# Patient Record
Sex: Female | Born: 1975 | Race: White | Hispanic: No | Marital: Married | Smoking: Never smoker
Health system: Southern US, Community
[De-identification: ages and names within clinical notes are randomized; demographics above are authoritative.]

## PROBLEM LIST (undated history)

## (undated) DIAGNOSIS — E119 Type 2 diabetes mellitus without complications: Secondary | ICD-10-CM

## (undated) DIAGNOSIS — G43909 Migraine, unspecified, not intractable, without status migrainosus: Secondary | ICD-10-CM

## (undated) DIAGNOSIS — R112 Nausea with vomiting, unspecified: Secondary | ICD-10-CM

## (undated) DIAGNOSIS — F419 Anxiety disorder, unspecified: Secondary | ICD-10-CM

## (undated) DIAGNOSIS — Z9889 Other specified postprocedural states: Secondary | ICD-10-CM

## (undated) DIAGNOSIS — M542 Cervicalgia: Secondary | ICD-10-CM

## (undated) DIAGNOSIS — K219 Gastro-esophageal reflux disease without esophagitis: Secondary | ICD-10-CM

## (undated) DIAGNOSIS — Z973 Presence of spectacles and contact lenses: Secondary | ICD-10-CM

## (undated) HISTORY — DX: Cervicalgia: M54.2

## (undated) HISTORY — PX: DILATION AND CURETTAGE OF UTERUS: SHX78

## (undated) HISTORY — PX: MELANOMA EXCISION: SHX5266

## (undated) HISTORY — PX: OTHER SURGICAL HISTORY: SHX169

## (undated) HISTORY — DX: Anxiety disorder, unspecified: F41.9

## (undated) HISTORY — DX: Migraine, unspecified, not intractable, without status migrainosus: G43.909

## (undated) HISTORY — DX: Gastro-esophageal reflux disease without esophagitis: K21.9

---

## 1997-10-07 ENCOUNTER — Inpatient Hospital Stay (HOSPITAL_COMMUNITY): Admission: AD | Admit: 1997-10-07 | Discharge: 1997-10-14 | Payer: Self-pay | Admitting: Sports Medicine

## 1997-10-21 ENCOUNTER — Encounter: Admission: RE | Admit: 1997-10-21 | Discharge: 1997-10-21 | Payer: Self-pay | Admitting: Sports Medicine

## 1998-08-31 ENCOUNTER — Other Ambulatory Visit: Admission: RE | Admit: 1998-08-31 | Discharge: 1998-08-31 | Payer: Self-pay | Admitting: Obstetrics and Gynecology

## 1998-12-12 ENCOUNTER — Encounter: Payer: Self-pay | Admitting: Internal Medicine

## 1998-12-12 ENCOUNTER — Ambulatory Visit (HOSPITAL_COMMUNITY): Admission: RE | Admit: 1998-12-12 | Discharge: 1998-12-12 | Payer: Self-pay | Admitting: Internal Medicine

## 1999-02-16 ENCOUNTER — Encounter: Payer: Self-pay | Admitting: Neurosurgery

## 1999-02-16 ENCOUNTER — Ambulatory Visit (HOSPITAL_COMMUNITY): Admission: RE | Admit: 1999-02-16 | Discharge: 1999-02-16 | Payer: Self-pay | Admitting: Neurosurgery

## 1999-03-16 ENCOUNTER — Encounter: Payer: Self-pay | Admitting: Neurology

## 1999-03-16 ENCOUNTER — Ambulatory Visit (HOSPITAL_COMMUNITY): Admission: RE | Admit: 1999-03-16 | Discharge: 1999-03-16 | Payer: Self-pay | Admitting: Neurology

## 1999-07-01 ENCOUNTER — Emergency Department (HOSPITAL_COMMUNITY): Admission: EM | Admit: 1999-07-01 | Discharge: 1999-07-01 | Payer: Self-pay | Admitting: Emergency Medicine

## 1999-07-01 ENCOUNTER — Encounter: Payer: Self-pay | Admitting: Emergency Medicine

## 1999-07-25 ENCOUNTER — Ambulatory Visit (HOSPITAL_COMMUNITY): Admission: RE | Admit: 1999-07-25 | Discharge: 1999-07-25 | Payer: Self-pay | Admitting: *Deleted

## 1999-08-24 ENCOUNTER — Ambulatory Visit (HOSPITAL_COMMUNITY): Admission: RE | Admit: 1999-08-24 | Discharge: 1999-08-24 | Payer: Self-pay | Admitting: Internal Medicine

## 1999-09-14 ENCOUNTER — Encounter: Payer: Self-pay | Admitting: Obstetrics and Gynecology

## 1999-09-14 ENCOUNTER — Encounter: Admission: RE | Admit: 1999-09-14 | Discharge: 1999-09-14 | Payer: Self-pay | Admitting: Obstetrics and Gynecology

## 1999-11-28 ENCOUNTER — Ambulatory Visit (HOSPITAL_COMMUNITY): Admission: RE | Admit: 1999-11-28 | Discharge: 1999-11-28 | Payer: Self-pay | Admitting: Neurology

## 1999-11-28 ENCOUNTER — Encounter: Payer: Self-pay | Admitting: Neurology

## 2000-02-18 ENCOUNTER — Other Ambulatory Visit: Admission: RE | Admit: 2000-02-18 | Discharge: 2000-02-18 | Payer: Self-pay | Admitting: Obstetrics and Gynecology

## 2000-06-30 ENCOUNTER — Emergency Department (HOSPITAL_COMMUNITY): Admission: EM | Admit: 2000-06-30 | Discharge: 2000-06-30 | Payer: Self-pay | Admitting: Emergency Medicine

## 2000-06-30 ENCOUNTER — Encounter: Payer: Self-pay | Admitting: Emergency Medicine

## 2001-11-06 ENCOUNTER — Other Ambulatory Visit: Admission: RE | Admit: 2001-11-06 | Discharge: 2001-11-06 | Payer: Self-pay | Admitting: Obstetrics and Gynecology

## 2002-03-17 ENCOUNTER — Inpatient Hospital Stay (HOSPITAL_COMMUNITY): Admission: AD | Admit: 2002-03-17 | Discharge: 2002-03-17 | Payer: Self-pay | Admitting: Obstetrics and Gynecology

## 2002-04-02 ENCOUNTER — Other Ambulatory Visit: Admission: RE | Admit: 2002-04-02 | Discharge: 2002-04-02 | Payer: Self-pay | Admitting: Obstetrics and Gynecology

## 2002-05-21 ENCOUNTER — Inpatient Hospital Stay (HOSPITAL_COMMUNITY): Admission: AD | Admit: 2002-05-21 | Discharge: 2002-05-21 | Payer: Self-pay | Admitting: Obstetrics and Gynecology

## 2002-07-29 ENCOUNTER — Encounter: Admission: RE | Admit: 2002-07-29 | Discharge: 2002-09-08 | Payer: Self-pay | Admitting: Occupational Medicine

## 2002-08-22 ENCOUNTER — Inpatient Hospital Stay (HOSPITAL_COMMUNITY): Admission: AD | Admit: 2002-08-22 | Discharge: 2002-08-22 | Payer: Self-pay | Admitting: *Deleted

## 2002-08-27 ENCOUNTER — Encounter: Payer: Self-pay | Admitting: Obstetrics and Gynecology

## 2002-08-27 ENCOUNTER — Inpatient Hospital Stay (HOSPITAL_COMMUNITY): Admission: AD | Admit: 2002-08-27 | Discharge: 2002-08-27 | Payer: Self-pay | Admitting: Obstetrics and Gynecology

## 2002-08-28 ENCOUNTER — Inpatient Hospital Stay (HOSPITAL_COMMUNITY): Admission: AD | Admit: 2002-08-28 | Discharge: 2002-08-28 | Payer: Self-pay | Admitting: Obstetrics and Gynecology

## 2002-10-12 ENCOUNTER — Inpatient Hospital Stay (HOSPITAL_COMMUNITY): Admission: AD | Admit: 2002-10-12 | Discharge: 2002-10-15 | Payer: Self-pay | Admitting: Obstetrics & Gynecology

## 2002-11-17 ENCOUNTER — Other Ambulatory Visit: Admission: RE | Admit: 2002-11-17 | Discharge: 2002-11-17 | Payer: Self-pay | Admitting: Obstetrics and Gynecology

## 2003-03-01 ENCOUNTER — Ambulatory Visit (HOSPITAL_COMMUNITY): Admission: RE | Admit: 2003-03-01 | Discharge: 2003-03-01 | Payer: Self-pay | Admitting: Neurology

## 2003-06-14 ENCOUNTER — Emergency Department (HOSPITAL_COMMUNITY): Admission: EM | Admit: 2003-06-14 | Discharge: 2003-06-14 | Payer: Self-pay | Admitting: Family Medicine

## 2003-11-15 ENCOUNTER — Ambulatory Visit (HOSPITAL_COMMUNITY): Admission: RE | Admit: 2003-11-15 | Discharge: 2003-11-15 | Payer: Self-pay | Admitting: Neurology

## 2004-01-03 ENCOUNTER — Emergency Department (HOSPITAL_COMMUNITY): Admission: EM | Admit: 2004-01-03 | Discharge: 2004-01-03 | Payer: Self-pay | Admitting: Family Medicine

## 2004-02-24 ENCOUNTER — Other Ambulatory Visit: Admission: RE | Admit: 2004-02-24 | Discharge: 2004-02-24 | Payer: Self-pay | Admitting: Obstetrics and Gynecology

## 2004-03-04 HISTORY — PX: DILATION AND CURETTAGE OF UTERUS: SHX78

## 2004-04-17 ENCOUNTER — Ambulatory Visit (HOSPITAL_COMMUNITY): Admission: RE | Admit: 2004-04-17 | Discharge: 2004-04-17 | Payer: Self-pay | Admitting: Family Medicine

## 2004-06-06 ENCOUNTER — Inpatient Hospital Stay (HOSPITAL_COMMUNITY): Admission: AD | Admit: 2004-06-06 | Discharge: 2004-06-06 | Payer: Self-pay | Admitting: Obstetrics and Gynecology

## 2004-06-22 ENCOUNTER — Ambulatory Visit (HOSPITAL_COMMUNITY): Admission: RE | Admit: 2004-06-22 | Discharge: 2004-06-22 | Payer: Self-pay | Admitting: Obstetrics and Gynecology

## 2004-06-22 ENCOUNTER — Encounter (INDEPENDENT_AMBULATORY_CARE_PROVIDER_SITE_OTHER): Payer: Self-pay | Admitting: Specialist

## 2004-09-09 ENCOUNTER — Inpatient Hospital Stay (HOSPITAL_COMMUNITY): Admission: AD | Admit: 2004-09-09 | Discharge: 2004-09-09 | Payer: Self-pay | Admitting: Obstetrics and Gynecology

## 2004-12-28 ENCOUNTER — Inpatient Hospital Stay (HOSPITAL_COMMUNITY): Admission: AD | Admit: 2004-12-28 | Discharge: 2004-12-28 | Payer: Self-pay | Admitting: Obstetrics and Gynecology

## 2005-03-14 ENCOUNTER — Inpatient Hospital Stay (HOSPITAL_COMMUNITY): Admission: AD | Admit: 2005-03-14 | Discharge: 2005-03-18 | Payer: Self-pay | Admitting: Obstetrics and Gynecology

## 2005-03-15 ENCOUNTER — Encounter (INDEPENDENT_AMBULATORY_CARE_PROVIDER_SITE_OTHER): Payer: Self-pay | Admitting: *Deleted

## 2005-04-25 ENCOUNTER — Other Ambulatory Visit: Admission: RE | Admit: 2005-04-25 | Discharge: 2005-04-25 | Payer: Self-pay | Admitting: Obstetrics and Gynecology

## 2005-05-29 ENCOUNTER — Ambulatory Visit (HOSPITAL_COMMUNITY): Admission: RE | Admit: 2005-05-29 | Discharge: 2005-05-29 | Payer: Self-pay | Admitting: Obstetrics and Gynecology

## 2005-12-21 ENCOUNTER — Ambulatory Visit (HOSPITAL_COMMUNITY): Admission: RE | Admit: 2005-12-21 | Discharge: 2005-12-21 | Payer: Self-pay | Admitting: Family Medicine

## 2006-03-04 HISTORY — PX: MELANOMA EXCISION: SHX5266

## 2006-06-19 ENCOUNTER — Ambulatory Visit: Payer: Self-pay | Admitting: Gastroenterology

## 2006-06-19 LAB — CONVERTED CEMR LAB
ALT: 15 units/L (ref 0–40)
AST: 16 units/L (ref 0–37)
Albumin: 3.9 g/dL (ref 3.5–5.2)
Alkaline Phosphatase: 67 units/L (ref 39–117)
BUN: 6 mg/dL (ref 6–23)
Basophils Absolute: 0 10*3/uL (ref 0.0–0.1)
Basophils Relative: 0 % (ref 0.0–1.0)
Bilirubin, Direct: 0.1 mg/dL (ref 0.0–0.3)
CO2: 28 meq/L (ref 19–32)
Calcium: 9.1 mg/dL (ref 8.4–10.5)
Chloride: 106 meq/L (ref 96–112)
Creatinine, Ser: 0.6 mg/dL (ref 0.4–1.2)
Eosinophils Absolute: 0.2 10*3/uL (ref 0.0–0.6)
Eosinophils Relative: 5.2 % — ABNORMAL HIGH (ref 0.0–5.0)
Folate: 9.9 ng/mL
GFR calc Af Amer: 153 mL/min
GFR calc non Af Amer: 127 mL/min
Glucose, Bld: 89 mg/dL (ref 70–99)
HCT: 38.4 % (ref 36.0–46.0)
Hemoglobin: 13.1 g/dL (ref 12.0–15.0)
Lymphocytes Relative: 25.9 % (ref 12.0–46.0)
MCHC: 34.1 g/dL (ref 30.0–36.0)
MCV: 86.5 fL (ref 78.0–100.0)
Monocytes Absolute: 0.2 10*3/uL (ref 0.2–0.7)
Monocytes Relative: 5.3 % (ref 3.0–11.0)
Neutro Abs: 3.1 10*3/uL (ref 1.4–7.7)
Neutrophils Relative %: 63.6 % (ref 43.0–77.0)
Platelets: 164 10*3/uL (ref 150–400)
Potassium: 4.3 meq/L (ref 3.5–5.1)
RBC: 4.43 M/uL (ref 3.87–5.11)
RDW: 12.5 % (ref 11.5–14.6)
Sed Rate: 6 mm/hr (ref 0–25)
Sodium: 142 meq/L (ref 135–145)
TSH: 1.56 microintl units/mL (ref 0.35–5.50)
Total Bilirubin: 0.5 mg/dL (ref 0.3–1.2)
Total Protein: 6.6 g/dL (ref 6.0–8.3)
Vitamin B-12: 516 pg/mL (ref 211–911)
WBC: 4.7 10*3/uL (ref 4.5–10.5)

## 2006-08-29 ENCOUNTER — Encounter: Admission: RE | Admit: 2006-08-29 | Discharge: 2006-08-29 | Payer: Self-pay | Admitting: Obstetrics and Gynecology

## 2006-10-01 ENCOUNTER — Emergency Department: Payer: Self-pay | Admitting: Emergency Medicine

## 2006-10-15 ENCOUNTER — Emergency Department: Payer: Self-pay | Admitting: Emergency Medicine

## 2007-01-16 ENCOUNTER — Ambulatory Visit: Payer: Self-pay

## 2007-04-06 ENCOUNTER — Ambulatory Visit: Payer: Self-pay

## 2007-07-29 ENCOUNTER — Encounter (INDEPENDENT_AMBULATORY_CARE_PROVIDER_SITE_OTHER): Payer: Self-pay | Admitting: *Deleted

## 2007-08-03 ENCOUNTER — Ambulatory Visit: Payer: Self-pay | Admitting: Gastroenterology

## 2007-08-03 LAB — HM PAP SMEAR

## 2007-08-03 LAB — CONVERTED CEMR LAB: Pap Smear: NORMAL

## 2007-08-10 ENCOUNTER — Telehealth: Payer: Self-pay | Admitting: Gastroenterology

## 2007-08-12 ENCOUNTER — Ambulatory Visit: Payer: Self-pay | Admitting: Gastroenterology

## 2007-08-12 ENCOUNTER — Encounter: Payer: Self-pay | Admitting: Gastroenterology

## 2007-08-12 HISTORY — PX: COLONOSCOPY WITH ESOPHAGOGASTRODUODENOSCOPY (EGD): SHX5779

## 2007-08-12 LAB — HM COLONOSCOPY

## 2007-08-18 ENCOUNTER — Encounter: Payer: Self-pay | Admitting: Gastroenterology

## 2008-09-02 ENCOUNTER — Ambulatory Visit: Payer: Self-pay | Admitting: Family Medicine

## 2008-09-02 DIAGNOSIS — R635 Abnormal weight gain: Secondary | ICD-10-CM

## 2008-09-02 DIAGNOSIS — R5381 Other malaise: Secondary | ICD-10-CM

## 2008-09-02 DIAGNOSIS — Z85820 Personal history of malignant melanoma of skin: Secondary | ICD-10-CM

## 2008-09-02 DIAGNOSIS — R5383 Other fatigue: Secondary | ICD-10-CM

## 2008-09-02 DIAGNOSIS — N83209 Unspecified ovarian cyst, unspecified side: Secondary | ICD-10-CM

## 2008-09-08 ENCOUNTER — Ambulatory Visit: Payer: Self-pay | Admitting: Family Medicine

## 2008-09-12 LAB — CONVERTED CEMR LAB
ALT: 18 units/L (ref 0–35)
AST: 15 units/L (ref 0–37)
Albumin: 3.8 g/dL (ref 3.5–5.2)
Alkaline Phosphatase: 73 units/L (ref 39–117)
BUN: 6 mg/dL (ref 6–23)
Basophils Absolute: 0.1 10*3/uL (ref 0.0–0.1)
Basophils Relative: 0.9 % (ref 0.0–3.0)
Bilirubin, Direct: 0.1 mg/dL (ref 0.0–0.3)
CO2: 27 meq/L (ref 19–32)
Calcium: 9.1 mg/dL (ref 8.4–10.5)
Chloride: 108 meq/L (ref 96–112)
Cholesterol: 200 mg/dL (ref 0–200)
Creatinine, Ser: 0.7 mg/dL (ref 0.4–1.2)
Eosinophils Absolute: 0.3 10*3/uL (ref 0.0–0.7)
Eosinophils Relative: 5.4 % — ABNORMAL HIGH (ref 0.0–5.0)
GFR calc non Af Amer: 103.63 mL/min (ref >60)
Glucose, Bld: 93 mg/dL (ref 70–99)
HCT: 39.1 % (ref 36.0–46.0)
HDL: 51.1 mg/dL (ref >39.00)
Hemoglobin: 13.5 g/dL (ref 12.0–15.0)
LDL Cholesterol: 126 mg/dL — ABNORMAL HIGH (ref 0–99)
Lymphocytes Relative: 18.5 % (ref 12.0–46.0)
Lymphs Abs: 1.2 10*3/uL (ref 0.7–4.0)
MCHC: 34.5 g/dL (ref 30.0–36.0)
MCV: 87.3 fL (ref 78.0–100.0)
Monocytes Absolute: 0.3 10*3/uL (ref 0.1–1.0)
Monocytes Relative: 5.3 % (ref 3.0–12.0)
Neutro Abs: 4.5 10*3/uL (ref 1.4–7.7)
Neutrophils Relative %: 69.9 % (ref 43.0–77.0)
Platelets: 170 10*3/uL (ref 150.0–400.0)
Potassium: 4 meq/L (ref 3.5–5.1)
RBC: 4.48 M/uL (ref 3.87–5.11)
RDW: 13 % (ref 11.5–14.6)
Rheumatoid fact SerPl-aCnc: <20 intl units/mL (ref 0.0–20.0)
Sed Rate: 21 mm/hr (ref 0–22)
Sodium: 140 meq/L (ref 135–145)
Total Bilirubin: 1 mg/dL (ref 0.3–1.2)
Total CHOL/HDL Ratio: 4
Total Protein: 6.6 g/dL (ref 6.0–8.3)
Triglycerides: 113 mg/dL (ref 0.0–149.0)
VLDL: 22.6 mg/dL (ref 0.0–40.0)
Vitamin B-12: 760 pg/mL (ref 211–911)
WBC: 6.4 10*3/uL (ref 4.5–10.5)

## 2009-01-19 ENCOUNTER — Telehealth: Payer: Self-pay | Admitting: Family Medicine

## 2009-02-21 ENCOUNTER — Telehealth: Payer: Self-pay | Admitting: Family Medicine

## 2009-05-08 ENCOUNTER — Ambulatory Visit: Payer: Self-pay | Admitting: Internal Medicine

## 2009-05-08 DIAGNOSIS — R1031 Right lower quadrant pain: Secondary | ICD-10-CM

## 2009-05-08 DIAGNOSIS — J029 Acute pharyngitis, unspecified: Secondary | ICD-10-CM

## 2009-05-08 LAB — CONVERTED CEMR LAB: Rapid Strep: NEGATIVE

## 2009-05-10 ENCOUNTER — Encounter: Admission: RE | Admit: 2009-05-10 | Discharge: 2009-05-10 | Payer: Self-pay | Admitting: Internal Medicine

## 2009-08-15 ENCOUNTER — Emergency Department (HOSPITAL_COMMUNITY): Admission: EM | Admit: 2009-08-15 | Discharge: 2009-08-15 | Payer: Self-pay | Admitting: Emergency Medicine

## 2010-03-24 ENCOUNTER — Encounter: Payer: Self-pay | Admitting: Neurology

## 2010-03-25 ENCOUNTER — Encounter: Payer: Self-pay | Admitting: Family Medicine

## 2010-03-25 ENCOUNTER — Encounter: Payer: Self-pay | Admitting: Neurology

## 2010-04-03 NOTE — Assessment & Plan Note (Signed)
Summary: STREP?  DR BEDSOLE'S PT Natale Milch   Vital Signs:  Patient profile:   35 year old female Height:      69 inches (175.26 cm) Weight:      224.2 pounds (101.91 kg) O2 Sat:      98 % on Room air Temp:     98.2 degrees F (36.78 degrees C) oral Pulse rate:   71 / minute BP sitting:   100 / 68  (left arm) Cuff size:   large  Vitals Entered By: Orlan Leavens (May 08, 2009 10:35 AM)  O2 Flow:  Room air CC: ? strep throat/ nausated, URI symptoms Is Patient Diabetic? No Pain Assessment Patient in pain? no        Primary Care Provider:  Kerby Nora MD  CC:  ? strep throat/ nausated and URI symptoms.  History of Present Illness:  URI Symptoms      This is a 35 year old woman who presents with URI symptoms.  The symptoms began 3 days ago.  The severity is described as severe.  The patient reports sore throat and sick contacts, but denies nasal congestion, dry cough, and earache.  Associated symptoms include low-grade fever (<100.5 degrees).  The patient denies dyspnea, wheezing, vomiting, and diarrhea.  The patient denies itchy throat, sneezing, seasonal symptoms, headache, and muscle aches.  The patient denies the following risk factors for Strep sinusitis: tender adenopathy.    Also noted continued episodes of RLQ pain - +hx same - prev felt related to ovarian cyst, but told by gyn that cyst has resolved - ?kidney problem - denies N/V or bowel changes - no fever or dysuria. not related to menstral cycle. ?if abd [place] can be arranged.  Current Medications (verified): 1)  Multivitamins   Tabs (Multiple Vitamin) .... Take 1 Tablet By Mouth Once A Day 2)  Vitamin D (Ergocalciferol) 50000 Unit Caps (Ergocalciferol) .... Take 1 Capsule By Mouth Once A Week 3)  Calcium 500 Mg Tabs (Calcium Carbonate) .Marland Kitchen.. 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    Physical Exam  General:  alert, well-developed, well-nourished, and cooperative to examination.   mildly ill Eyes:  vision grossly intact; pupils equal, round and reactive to light.  conjunctiva and lids normal.    Ears:  normal pinnae bilaterally, without erythema, swelling, or tenderness to palpation. TMs clear, without effusion, or cerumen impaction. Hearing grossly normal bilaterally  Mouth:  teeth and gums in good repair; mucous membranes moist, without lesions or ulcers. oropharynx clear without exudate, mod erythema. +tiny vesicles near uvula -no PND Neck:  supple, full ROM, no masses, no thyromegaly; no thyroid nodules or tenderness. no JVD or carotid bruits.   Lungs:  normal respiratory effort, no intercostal retractions or use of accessory muscles; normal breath sounds bilaterally - no crackles and no wheezes.    Heart:  normal rate, regular rhythm, no murmur, and no rub. BLE without edema.  Abdomen:  soft, non-tender, normal bowel sounds, no distention; no masses and no appreciable hepatomegaly or splenomegaly.     Impression & Recommendations:  Problem # 1:  PHARYNGITIS (ICD-462) likely viral but +expsoure to strep -  wil tx emeprically for same though note rapid test negative - pt informed of same Her updated medication list for this problem includes:    Amoxicillin 500 Mg Caps (Amoxicillin) .Marland Kitchen... 1 by mouth three times a day x 7 days  Orders: Rapid Strep (82956)  Instructed to complete antibiotics and call if not improved in 48 hours.   Problem # 2:  ABDOMINAL PAIN, RIGHT LOWER QUADRANT (ICD-789.03)  intermittent - none now - exam benign will check abd [place] per pt request and rec f/u woth PCP on  same Orders: Radiology Referral (Radiology)  Discussed symptom control with the patient.   Complete Medication List: 1)  Multivitamins Tabs (Multiple vitamin) .... Take 1 tablet by mouth once a day 2)  Vitamin D (ergocalciferol) 50000 Unit Caps (Ergocalciferol) .... Take 1 capsule by mouth once a week 3)  Calcium 500 Mg Tabs (Calcium carbonate) .Marland Kitchen.. 1 tab by mouth daily 4)  Amoxicillin 500 Mg Caps (Amoxicillin) .Marland Kitchen.. 1 by mouth three times a day x 7 days  Patient Instructions: 1)  it was good to see you today. 2)  amoxicillin antibiotics as discussed - 3)  we'll make referral for abdominal ultrasound. Our office will contact you regarding this appointment once made.  4)  Get plenty of rest, drink lots of clear liquids, and use Tylenol or Ibuprofen for fever and comfort. Return in 7-10 days if you're not better:sooner if you're feeling worse. Prescriptions: AMOXICILLIN 500 MG CAPS (AMOXICILLIN) 1 by mouth three times a day x 7 days  #21 x 0   Entered and Authorized by:   Newt Lukes MD   Signed by:   Newt Lukes MD on 05/08/2009   Method used:   Electronically to        [facility]       [address]       [place], [place]  [address]       Ph: [phone]       Fax: [phone]   RxID:   [id number]   Laboratory Results    Other Tests  Rapid Strep: negative

## 2010-06-19 ENCOUNTER — Encounter: Payer: Self-pay | Admitting: Family Medicine

## 2010-06-19 ENCOUNTER — Other Ambulatory Visit: Payer: Self-pay | Admitting: Physician Assistant

## 2010-06-20 ENCOUNTER — Ambulatory Visit: Payer: Self-pay | Admitting: Family Medicine

## 2010-07-10 ENCOUNTER — Encounter: Payer: Self-pay | Admitting: Family Medicine

## 2010-07-10 ENCOUNTER — Ambulatory Visit (INDEPENDENT_AMBULATORY_CARE_PROVIDER_SITE_OTHER): Payer: 59 | Admitting: Family Medicine

## 2010-07-10 DIAGNOSIS — W57XXXA Bitten or stung by nonvenomous insect and other nonvenomous arthropods, initial encounter: Secondary | ICD-10-CM | POA: Insufficient documentation

## 2010-07-10 DIAGNOSIS — R5381 Other malaise: Secondary | ICD-10-CM

## 2010-07-10 DIAGNOSIS — H60399 Other infective otitis externa, unspecified ear: Secondary | ICD-10-CM

## 2010-07-10 DIAGNOSIS — R5383 Other fatigue: Secondary | ICD-10-CM | POA: Insufficient documentation

## 2010-07-10 DIAGNOSIS — H6090 Unspecified otitis externa, unspecified ear: Secondary | ICD-10-CM | POA: Insufficient documentation

## 2010-07-10 LAB — CBC WITH DIFFERENTIAL/PLATELET
Basophils Absolute: 0 10*3/uL (ref 0.0–0.1)
Basophils Relative: 0.3 % (ref 0.0–3.0)
Eosinophils Absolute: 0.3 10*3/uL (ref 0.0–0.7)
Eosinophils Relative: 4.4 % (ref 0.0–5.0)
HCT: 38.6 % (ref 36.0–46.0)
Hemoglobin: 12.9 g/dL (ref 12.0–15.0)
Lymphocytes Relative: 18.9 % (ref 12.0–46.0)
Lymphs Abs: 1.3 10*3/uL (ref 0.7–4.0)
MCHC: 33.3 g/dL (ref 30.0–36.0)
MCV: 84.5 fl (ref 78.0–100.0)
Monocytes Absolute: 0.4 10*3/uL (ref 0.1–1.0)
Monocytes Relative: 5.5 % (ref 3.0–12.0)
Neutro Abs: 4.9 10*3/uL (ref 1.4–7.7)
Neutrophils Relative %: 70.9 % (ref 43.0–77.0)
Platelets: 181 10*3/uL (ref 150.0–400.0)
RBC: 4.57 Mil/uL (ref 3.87–5.11)
RDW: 15.2 % — ABNORMAL HIGH (ref 11.5–14.6)
WBC: 6.9 10*3/uL (ref 4.5–10.5)

## 2010-07-10 LAB — TSH: TSH: 1.69 u[IU]/mL (ref 0.35–5.50)

## 2010-07-10 MED ORDER — CIPROFLOXACIN-DEXAMETHASONE 0.3-0.1 % OT SUSP: 4.0000 [drp] | Freq: Two times a day (BID) | OTIC | Status: AC

## 2010-07-10 NOTE — Assessment & Plan Note (Signed)
New. S/p 14 day course of doxycyline.  Requested records to be sent here. Reassurance provided to pt since she if feeling better and she was treated. Will check TSH and CBC given pt's concern of fatigue and weight gain.

## 2010-07-10 NOTE — Progress Notes (Signed)
35 yo pt of Dr. Ermalene Searing here to follow up "tick borne illness" and ear pain.  Tick borne illness- found a tick on her left forearm on 06/18/2010, removed it.  Thinks it was there for several weeks (thought it was a mole). Was feeling run down, low grade subjective fever and body aches so went to [facility] employee clinic. Per pt, RMSF titers and Lyme titers neg. Placed on 14 day course of doxycycline. Feels much better now. No rash, no HA, no fevers, no myalgias. Just still feels "a little run down." Gaining weight. Wants her thyroid checked.  Bilateral ear pain- went to beach, came back and both ears are hurting to touch. No discharge or URI symptoms.  The PMH, PSH, Social History, Family History, Medications, and allergies have been reviewed in [facility], and have been updated if relevant.  ROS: Patient reports no vision/ hearing  changes, adenopathy,fever, weight change,  persistant / recurrent hoarseness , swallowing issues, chest pain,palpitations,edema,persistant /recurrent cough, hemoptysis, dyspnea( rest/ exertional/paroxysmal nocturnal), gastrointestinal bleeding(melena, rectal bleeding), abdominal pain, significant heartburn boel changes,GU symptoms(dysuria, hematuria,pyuria, incontinence) ), Gyn symptoms(abnormal  bleeding , pain),  syncope, focal weakness, memory loss,numbness & tingling, skin/hair /nail changes,abnormal bruising or bleeding, anxiety,or depression.  Physical exam: BP 128/80  Pulse 88  Temp(Src) 98.5 F (36.9 C) (Oral)  Ht 5\' 11"  (1.803 m)  Wt 235 lb 12.8 oz (106.958 kg)  BMI 32.89 kg/m2  LMP 07/09/2010 Gen:  Alert, overweight appearing, NAD Ears:  Pina mildly ttp bilaterally, canals mildly erythematous, TM normal. No discharge CVS: RRR Resp:  CTA bilaterally

## 2010-07-12 ENCOUNTER — Ambulatory Visit: Payer: Self-pay | Admitting: Family Medicine

## 2010-07-20 ENCOUNTER — Telehealth: Payer: Self-pay | Admitting: *Deleted

## 2010-07-20 NOTE — Assessment & Plan Note (Signed)
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 She is self referred today for evaluation of left lower  quadrant pain and rectal bleeding.   Mrs. Willadsen has a long history of irritable bowel syndrome with  alternating diarrhea and constipation. For the last 6 months she has had  a dull aching sensation in her left lower quadrant with mostly diarrhea  and occasional rather heavy bright red blood per rectum. She denies  rectal pain or known hemorrhoids. She has no upper GI, hepatobiliary  complaints. She also denies anorexia, weight loss, fever, chills, or any  systemic complaints. She apparently saw Dr. Sabino Gasser in 1994 and had a  colonoscopy exam which was apparently normal. Her appetite is good and  her weight is stable and she denies any food intolerances. She has not  had associated arthritis, skin rashes, mouth sores, etc. She denies  abuse of NSAIDs or frequent antibiotic use.   MEDICATIONS:  Prenatal vitamins.   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   PAST MEDICAL HISTORY:  Otherwise noncontributory. She did have a  Cesarean section in January of 2007. She had a dysplastic mole removed  from her left mid abdominal area and left leg in the past. She denies  chronic medical problems otherwise.   FAMILY HISTORY:  Remarkable for several aunts who have had colon cancer  and breast cancer.   SOCIAL HISTORY:  She is married and lives with her husband, has a  Probation officer. She does not smoke or use ethanol.   REVIEW OF SYSTEMS:  Noncontributory with last menstrual period on June 17, 2006. She is breast feeding at this time.   PHYSICAL EXAMINATION:  Shows her to be a health attractive  appearing  white female in no distress, appearing her stated age. She is 5 feet 10  and a half inches tall and weighs 187 pounds. Blood pressure 110/68 and  pulse was 68 and regular. I could not appreciate stigmata of chronic  liver disease or thyromegaly.  CHEST: Entirely clear and she was in a regular rhythm without murmurs,  gallops, or rubs.  I could not appreciate hepatosplenomegaly, abdominal masses. There was  tenderness to deep palpation of the left lower quadrant of the sigmoid  colon. There was no rebound tenderness. Bowel sounds were normal.  Peripheral extremities were unremarkable.  MENTAL STATUS: Normal.  Inspection of the rectum was unremarkable as was the rectal exam. She  did have a posterior skin tag but I could not appreciate a definite  fissure. There was no rectal tenderness, and stool was guaiac negative.   ASSESSMENT:  1. Chronic irritable bowel syndrome with atypical left lower quadrant      pain.  2. Probable hemorrhoidal bleeding- rule out inflammatory bowel      disease.  3. Family history of colon and breast carcinoma.  RECOMMENDATIONS:  1. Screening and laboratory parameters.  2. Outpatient colonoscopy exam.  3. Will hold on any medications until workup is completed for her      breast feeding status.     Vania Rea. Jarold Motto, MD, Caleen Essex, FAGA  Electronically Signed    DRP/MedQ  DD: 06/19/2006  DT: 06/19/2006  Job #: [id number]   cc:   Marcelino Duster L. Vincente Poli, M.D.  Maura Hamrick

## 2010-07-20 NOTE — Telephone Encounter (Signed)
Patient was seen last week for a tick bite. She says that she is still not feeling herself. She is asking if she could possibly come in for more labs. She is asking for a BMP and would also like her liver checked.

## 2010-07-20 NOTE — Procedures (Signed)
[facility]  Patient:    Candice [name], Candice [name]                     MRN: [id number] Proc. Date: 08/24/99 Adm. Date:  [id number] Disc. Date: [id number] Attending:  Nathen May CC:         Electrophysiology Laboratory             Marlan Palau, M.D.             Trudee Kuster, M.D.                           Procedure Report  PROCEDURE:  Tilt table testing.  SURGEON:  Nathen May, M.D., [facility].  ANESTHESIA:  INDICATIONS:  The patient is a 35 year old woman with recurrent episodes of loss of consciousness, _________ consistent with neurally mediated syncope. She also has a ________ EEG, and she is admitted for tilt table testing to try and clarify the mechanism of her syncope, and exclude as best as possible an underlying seizure disorder.  DESCRIPTION OF PROCEDURE:  The patient was equilibrated in the supine position.  The patient was then tilted upright at 70 degrees.  There was a gradual increase in heart rate over the initial 12 minutes from a resting rate of 65 to a peak rate of 98, unaccompanied by significant changes in blood pressure.  The patient then had instability of blood pressure and heart rate over the subsequent 20 minutes of the test with blood pressures ranging from the 83 range to the 123 range in a variable pattern.  The heart rate would also change from a ______ of 64 to a peak of 108.  The patient was then returned to the supine position.  Isoproterenol was started and titrated up to 2 mcg per minute to achieve a 25% increase in incremental heart rate.  The patient was then tilted upright for 70 degrees. There was again a moderate increase in her heart rate and a stable blood pressure.  IMPRESSION: 1. Blood pressure and heart instability associated with upright posture. 2. Mild orthostasis.  RECOMMENDATIONS:  I will continue her on salt and water which has been associated with some improvement in her  symptoms.  If she continues to have symptoms when she follows up with either Trudee Kuster, M.D. or Marlan Palau, M.D., I would recommend the consideration of Florinef therapy concomitantly.  In the event that this is able to render her symptom free after six months to a year, I would recommend tapering the dose and seeing if she remains symptoms free off medication. DD:  08/24/99 TD:  08/27/99 Job: [id number]

## 2010-07-20 NOTE — Op Note (Signed)
Candice [name], Candice [name]           ACCOUNT NO.:  [id number]   MEDICAL RECORD NO.:  [id number]          PATIENT TYPE:  AMB   LOCATION:  SDC                           FACILITY:  [facility]   PHYSICIAN:  Rashidah L. Grewal, M.D.DATE OF BIRTH:  1975-08-24   DATE OF PROCEDURE:  06/22/2004  DATE OF DISCHARGE:                                 OPERATIVE REPORT   PREOPERATIVE DIAGNOSIS:  Missed abortion.   POSTOPERATIVE DIAGNOSIS:  Missed abortion.   PROCEDURE:  Dilatation and evacuation.   SURGEON:  Nylan L. Vincente Poli, M.D.   SPECIMENS:  Products of conception.   COMPLICATIONS:  None.   DESCRIPTION OF PROCEDURE:  The patient was taken to the operating room.  She  was given sedation and placed in the lithotomy position.  She was prepped  and draped in the usual sterile fashion.  An in-and-out catheter was used to  empty the bladder.   The speculum was inserted into the vagina.  The cervix was grasped with a  tenaculum.  A paracervical block was performed in the standard fashion.  The  cervix was noted to be partially open and did not really need to be dilated,  but I did use the Pratt dilators to make sure it was open enough to fit a #7  suction cannula.  The cannula was inserted.  The uterus was thoroughly  suctioned of all tissue.  It was grossly consistent with products of  conception.  The curette was removed, and then a sharp curette was inserted.  The uterus was then thoroughly curetted of all tissue.  We then removed that  and then inserted the suction cannula a final time and retrieved very scant  tissue.  At the end of the procedure, all instruments were removed from the  vagina, with no active uterine bleeding noted.  All sponge, lap, and  instrument count were correct x2.   The patient tolerated the procedure extremely well, and she went to the  recovery room in stable condition.      MLG/MEDQ  D:  06/22/2004  T:  06/22/2004  Job:  [id number]

## 2010-07-20 NOTE — Op Note (Signed)
NAMEMAURI, TEMKIN NO.:  [id number]   MEDICAL RECORD NO.:  [id number]          PATIENT TYPE:  INP   LOCATION:  9106                          FACILITY:  [facility]   PHYSICIAN:  Freddy Finner, M.D.   DATE OF BIRTH:  02/18/1976   DATE OF PROCEDURE:  03/15/2005  DATE OF DISCHARGE:                                 OPERATIVE REPORT   PREOPERATIVE DIAGNOSES:  1.  Intrauterine pregnancy at 37 weeks' gestation.  2.  Failure to progress in labor.  3.  Mild late onset of pregnancy induced hypertension.   POSTOPERATIVE DIAGNOSES:  1.  Intrauterine pregnancy at 63 weeks' gestation with delivery of viable      female infant, Apgar's 8 and 9, arterial cord pH of 7.31.  2.  Failure to progress in labor.  3.  Mild late onset of pregnancy induced hypertension.   SURGEON:  Freddy Finner, M.D.   INDICATIONS FOR PROCEDURE:  The patient is a 35 year old, white, married  female, G2, P1 who delivered her first pregnancy vaginally who is admitted  at 37 weeks with mild late onset pregnancy induced hypertension for  induction of labor.  She had amniotomy performed on March 14, 2005, at  approximately noon.  She had antibiotic prophylaxis for beta Streptococcus  throughout her labor.  She had augmentation of her labor with IV Pitocin.  Throughout the course of her labor, the labor was dysfunctional and  eventually with intrauterine pressure catheter to document adequate labor.  She had at least 3 hours of adequate labor with very little, if any,  cervical change and, in fact, very little change over a much longer interval  of time.  It was elected to proceed with cesarean delivery.  Her epidural,  which was in place, was dosed for surgery.   DESCRIPTION OF PROCEDURE:  She was placed in the dorsal recumbent position  after arrival in the operating room.  The abdomen was prepped and draped in  the usual fashion.  Foley catheter was indwelling.  A low abdominal  transverse skin  incision was made and carried sharply down the fascia.  Bleeding subcutaneous vessels were controlled with the Bovie.  Fascia was  entered sharply and extended to the extent of skin incision.  Rectus sheath  was developed superiorly and inferiorly by blunt and sharp dissection.  Rectus muscle was divided in the midline.  Peritoneum was elevated, entered  sharply and extended bluntly to the extent of the skin incision.  Transverse  incision was made in the visceral peritoneum overlying the lower uterine  segment and the bladder bluntly dissected off the lower segment.  Transverse  incision was made and the lower uterine segment extended bluntly in the  transverse direction.  A Kiwi vacuum extractor was used to facilitate the  delivery and a viable female infant was delivered without significant  difficulty.  Apgar's and cord pH were noted above.  Placenta and other parts  of conception removed from the uterus after collection of cord blood both  arterial and venous.  The uterus was delivered onto the anterior abdominal  wall.  The uterus  itself was normal as were tubes and ovaries.  Uterine  incision was closed and abdominal layer with running, locking 0 Vicryl for  the first layer and an embrocating suture of 0 Monocryl for the second  layer.  Hematoma was noted in the left broad ligament at the level of the  lower uterine segment at the incision margin.  This was carefully manually  expressed with careful digital palpation and extension of the incision far  to the left and was closed with running  0 Monocryl suture.  Hemostasis at  this point was felt to be adequate.  Bladder flap was reapproximated with  running 0 Monocryl suture.  Uterus, tubes and ovaries were placed back into  the abdominal cavity.  Irrigation was carried out.  Hemostasis was still  noted to be completed.  All pack, needle and instrument counts were correct.  Abdominal incision was then closed in layers.  Running 0  Monocryl was used  to close the peritoneum and reapproximate the rectus muscles.  Fascia was  closed with running PDS.  Subcutaneous was closed with running 3-0 plain.  Skin was closed with wide skin staples and Steri-Strips.  The estimated  intraoperative blood loss was less than or equal to 800 mL.  The patient was  taken to the recovery room in good condition after completion of the  procedure.      Freddy Finner, M.D.  Electronically Signed     WRN/MEDQ  D:  03/15/2005  T:  03/16/2005  Job:  [id number]

## 2010-07-20 NOTE — Telephone Encounter (Signed)
Left message on machine at home for patient to return call. 

## 2010-07-20 NOTE — Discharge Summary (Signed)
NAMEMARLIYAH, Candice [name] NO.:  [id number]   MEDICAL RECORD NO.:  [id number]          PATIENT TYPE:  INP   LOCATION:  9106                          FACILITY:  [facility]   PHYSICIAN:  Juluis Mire, M.D.   DATE OF BIRTH:  Nov 15, 1975   DATE OF ADMISSION:  03/14/2005  DATE OF DISCHARGE:  03/18/2005                                 DISCHARGE SUMMARY   ADMITTING DIAGNOSIS:  1.  Intrauterine pregnancy at 37 weeks estimated gestational age.  2.  Induction of labor secondary to pregnancy-induced hypertension.   DISCHARGE DIAGNOSIS:  Status post low transverse cesarean section secondary  to failure to progress and viable female infant.   PROCEDURE:  Primary low transverse cesarean section.   REASON FOR ADMISSION:  Please see written H&P.   HOSPITAL COURSE:  Patient is a 35 year old gravida 3, para 1 that was  admitted to [facility] for an induction of labor secondary  to pregnancy-induced hypertension. The patient was also noted to have  positive group B beta strep. On admission vital signs stable. She was  afebrile. Blood pressure 140s/80s to 90s. Fetal heart tones were reactive.  Cervical exam revealed cervix dilated to 2.5 cm, 50% effaced, vertex at  minus two station. Artificial rupture of membranes was performed which  revealed clear fluid. IV antibiotics were administered for group B beta  Strep prophylaxis. Fetal heart tones remained reactive. Contractions were  noted to be slightly irregular. IV Pitocin was administered for augmentation  of her labor and intrauterine pressure catheter was applied for adequate  evaluation of her labor. PIH labs were drawn which were within normal  limits. Uric acid was 3.7. The patient did progress to approximately 8 cm of  dilatation. The vertex remained high in the pelvis at a -3 station. After  approximately 3 hours of adequate labor as determined by intrauterine  pressure catheter, no further change was made and  decision was made to  proceed with a primary low transverse cesarean section. The patient was now  transferred to the operating room where epidural was dosed to an adequate  surgical level. Low transverse incision was made with delivery of a viable  female infant weighing 6 pounds 13 ounces with Apgars of 8 at 1 and 9 at 5  minutes. Arterial cord pH of 7.31. The patient tolerated procedure well and  was taken to the recovery room in stable condition. On postoperative day #1  the patient did complain of a cough. Otherwise she was without complaint.  Vital signs were stable. Fundus firm and nontender. Abdominal dressing was  noted to be clean, dry and intact. Laboratory findings revealed hemoglobin  of 10.1. On postoperative day #2 the patient continued to have a  nonproductive cough however was improved. Vital signs stable. She remained  afebrile. Fundus firm and nontender. Abdominal dressing had now been removed  revealing incision that is clean, dry and intact. On postoperative day #3  the patient was without complaint. Vital signs stable. Fundus firm and  nontender. Incision was clean, dry and intact. She is ambulating well,  tolerating a regular diet without complaints of  nausea, vomiting.   CONDITION ON DISCHARGE:  Stable.   DIET:  Regular as tolerated.   ACTIVITY:  No heavy lifting, no driving x2 weeks, no vaginal entry.   FOLLOW UP:  Patient is to follow up in the office in 1 week for incision  check. She is to call for temperature greater than 100 degrees, persistent  nausea, vomiting, heavy vaginal bleeding and/or redness or drainage from  incisional site.   DISCHARGE MEDICATIONS:  1.  Tylox #30 one p.o. every 4 to 6 hours p.r.n.  2.  Tussionex 4 ounce bottle 1 teaspoon q.12 p.r.n. cough.  3.  Prenatal vitamins one p.o. daily.      Candice [name], N.P.      Juluis Mire, M.D.  Electronically Signed    CC/MEDQ  D:  04/03/2005  T:  04/03/2005  Job:  [id number]

## 2010-07-20 NOTE — Telephone Encounter (Signed)
Labs scheduled for 07/23/2010, BMP and LFT.  Patient advised as instructed via telephone.

## 2010-07-20 NOTE — Procedures (Signed)
[facility]  Patient:    Candice [name], GALAS                     MRN: [id number] Adm. Date:  [id number] Disc. Date: [id number] Attending:  Sabino Gasser                           Procedure Report  PROCEDURE:  Colonoscopy.  INDICATION:  Rectal bleeding, rule out Crohns disease.  PREMEDICATION:  Demerol 100 mg and Versed 7 mg given intravenously in divided dose.  DESCRIPTION OF PROCEDURE:  With the patient mildly sedated in the left lateral decubitus position, the Olympus video colonoscope was inserted into the rectum and passed under direct vision to the cecum.  It was identified by ileocecal valve and appendiceal orifice, both of which were photographed.  We entered into the terminal ileum through the ileocecal valve and this too appeared normal and was photographed.  The endoscope was then withdrawn taking circumferential views of the entire colonic mucosa, stopping only in the rectum which appeared normal under direct and retroflex view.  The endoscope was straightened and withdrawn.  The patients vital signs and pulse oximeter remained stable.  The patient tolerated the procedure well without apparent complications.  FINDINGS:  Essentially normal colonoscopic examination including terminal ileum.  PLAN:  Have the patient follow up with me on an as needed basis. DD:  07/25/99 TD:  07/29/99 Job: [id number]

## 2010-07-20 NOTE — Telephone Encounter (Signed)
Yes this is fine. Please still keep appt scheduled with Dr. Ermalene Searing for follow up.

## 2010-07-23 ENCOUNTER — Other Ambulatory Visit (INDEPENDENT_AMBULATORY_CARE_PROVIDER_SITE_OTHER): Payer: 59 | Admitting: Family Medicine

## 2010-07-23 DIAGNOSIS — R5383 Other fatigue: Secondary | ICD-10-CM

## 2010-07-23 DIAGNOSIS — R5381 Other malaise: Secondary | ICD-10-CM

## 2010-07-23 LAB — BASIC METABOLIC PANEL
BUN: 8 mg/dL (ref 6–23)
CO2: 27 mEq/L (ref 19–32)
Calcium: 9.3 mg/dL (ref 8.4–10.5)
Chloride: 104 mEq/L (ref 96–112)
Creatinine, Ser: 0.6 mg/dL (ref 0.4–1.2)
GFR: 120.04 mL/min (ref >60.00)
Glucose, Bld: 103 mg/dL — ABNORMAL HIGH (ref 70–99)
Potassium: 4.4 mEq/L (ref 3.5–5.1)
Sodium: 139 mEq/L (ref 135–145)

## 2010-07-23 LAB — HEPATIC FUNCTION PANEL
Alkaline Phosphatase: 63 U/L (ref 39–117)
Bilirubin, Direct: 0.1 mg/dL (ref 0.0–0.3)
Total Bilirubin: 0.4 mg/dL (ref 0.3–1.2)
Total Protein: 6.2 g/dL (ref 6.0–8.3)

## 2010-07-23 NOTE — Patient Instructions (Signed)
Notify pt that labs are nml except one liver funciton test mildly elevated. Stop all tylenol, alcohol etc. Recheck in 2 weeks hepatic panel Dx elevated transaminases. Make follow up sooner if still feeling ill.

## 2010-07-23 NOTE — Progress Notes (Signed)
Perhaps elevated from Tylenol use secondary to tick borne illness? I would repeat at her follow up.

## 2010-08-02 ENCOUNTER — Telehealth: Payer: Self-pay | Admitting: *Deleted

## 2010-08-02 NOTE — Telephone Encounter (Signed)
Pt was dx'd with a tick borne illness.  She took 14 days of doxycycline but she thinks she needs another week or two of this.  She has done some research and has been heard that she should have been treated for 3-4 weeks.  She finished her 14 day course on 5/14 so has been off of this for about 2 weeks, says she would feel better if she could take another 2 week course.  Uses [facility] in [place].

## 2010-08-02 NOTE — Telephone Encounter (Signed)
She was supposed to follow up with Dr. Ermalene Searing for this. I will forward to her.

## 2010-08-03 MED ORDER — DOXYCYCLINE HYCLATE 100 MG PO TABS: 100.0000 mg | ORAL_TABLET | Freq: Two times a day (BID) | ORAL | Status: AC

## 2012-03-09 ENCOUNTER — Other Ambulatory Visit: Payer: Self-pay | Admitting: Obstetrics and Gynecology

## 2013-02-23 ENCOUNTER — Ambulatory Visit: Payer: 59 | Admitting: Emergency Medicine

## 2013-02-23 VITALS — BP 142/80 | HR 81 | Temp 98.6°F | Resp 16 | Ht 71.0 in | Wt 243.0 lb

## 2013-02-23 DIAGNOSIS — M79645 Pain in left finger(s): Secondary | ICD-10-CM

## 2013-02-23 DIAGNOSIS — S61012A Laceration without foreign body of left thumb without damage to nail, initial encounter: Secondary | ICD-10-CM

## 2013-02-23 DIAGNOSIS — M79609 Pain in unspecified limb: Secondary | ICD-10-CM

## 2013-02-23 DIAGNOSIS — S61209A Unspecified open wound of unspecified finger without damage to nail, initial encounter: Secondary | ICD-10-CM

## 2013-02-23 NOTE — Progress Notes (Signed)
   Patient ID: Candice [name] MRN: [id number], DOB: [birth year], 37 y.o. Date of Encounter: 02/23/2013, 8:04 PM   PROCEDURE NOTE: Verbal consent obtained.  Risks and benefits of the procedure were explained. Patient made an informed decision to proceed with the procedure. Sterile technique employed. Numbing: Anesthesia obtained with 2% plain lidocaine 3 cc for local anesthesia.   Cleansed with soap and water. Irrigated. Betadine prep per usual protocol.  Wound explored, no deep structures involved, no foreign bodies.   Wound repaired with # 3 simple interrupted sutures.  Hemostasis obtained. Wound cleansed and dressed.  Wound care instructions including precautions covered with patient. Handout given.  Anticipate suture removal in 10 days.   Signed, Eula Listen, PA-C [facility] [place], [place] [address] [phone] 02/23/2013 8:04 PM

## 2013-02-23 NOTE — Progress Notes (Signed)
[facility] [address], [place] [address] [phone]  Date:  02/23/2013   Name:  Candice [name]   DOB:  June 20, 1975   MRN:  [id number]  PCP:  Kerby Nora, MD    Chief Complaint: Laceration   History of Present Illness:  Candice [name] is a 37 y.o. very pleasant female patient who presents with the following:  Injured while peeling apples and suffered a laceration of the proximal left thumb.  Current on TD.  No improvement with over the counter medications or other home remedies. Denies other complaint or health concern today.   Patient Active Problem List   Diagnosis Date Noted  . Tick bite 07/10/2010  . Fatigue 07/10/2010  . Otitis externa 07/10/2010  . PHARYNGITIS 05/08/2009  . ABDOMINAL PAIN, RIGHT LOWER QUADRANT 05/08/2009  . OVARIAN CYST 09/02/2008  . FATIGUE 09/02/2008  . WEIGHT GAIN, ABNORMAL 09/02/2008  . PERSONAL HISTORY OF MALIGNANT MELANOMA OF SKIN 09/02/2008    No past medical history on file.  Past Surgical History  Procedure Laterality Date  . Cesarean section    . Melanoma excision      History  Substance Use Topics  . Smoking status: Never Smoker   . Smokeless tobacco: Not on file  . Alcohol Use: No    Family History  Problem Relation Age of Onset  . Arthritis Mother   . Diabetes Mother   . Hypertension Father   . Heart disease Maternal Grandfather     CAD, MI  . Cancer Paternal Grandmother     melanoma  . Cancer Paternal Grandfather     lung    No Known Allergies  Medication list has been reviewed and updated.  Current Outpatient Prescriptions on File Prior to Visit  Medication Sig Dispense Refill  . calcium gluconate 500 MG tablet Take 500 mg by mouth daily.        . ergocalciferol (VITAMIN D2) 50000 UNITS capsule Take 50,000 Units by mouth once a week.        . Multiple Vitamin (MULTIVITAMIN) tablet Take 1 tablet by mouth daily.         No current facility-administered medications on file  prior to visit.    Review of Systems:  As per HPI, otherwise negative.    Physical Examination: Filed Vitals:   02/23/13 1843  BP: 142/80  Pulse: 81  Temp: 98.6 F (37 C)  Resp: 16   Filed Vitals:   02/23/13 1843  Height: 5\' 11"  (1.803 m)  Weight: 243 lb (110.224 kg)   Body mass index is 33.91 kg/(m^2). Ideal Body Weight: Weight in (lb) to have BMI = 25: 178.9   GEN: WDWN, NAD, Non-toxic, Alert & Oriented x 3 HEENT: Atraumatic, Normocephalic.  Ears and Nose: No external deformity. EXTR: No clubbing/cyanosis/edema NEURO: Normal gait.  PSYCH: Normally interactive. Conversant. Not depressed or anxious appearing.  Calm demeanor.  LEFT thumb:  Linear superficial laceration proximal phalange.  NATI.  No fb. RIGHT thumb:  Superficial laceration no suture needed  Assessment and Plan: Laceration left thumb  Signed,  Phillips Odor, MD

## 2013-02-23 NOTE — Patient Instructions (Signed)

## 2013-08-12 ENCOUNTER — Other Ambulatory Visit: Payer: Self-pay | Admitting: Obstetrics and Gynecology

## 2013-08-13 LAB — CYTOLOGY - PAP

## 2014-08-18 ENCOUNTER — Encounter: Payer: Self-pay | Admitting: Gastroenterology

## 2014-08-29 ENCOUNTER — Other Ambulatory Visit: Payer: Self-pay | Admitting: Obstetrics and Gynecology

## 2014-08-30 LAB — CYTOLOGY - PAP

## 2014-12-17 ENCOUNTER — Encounter: Payer: Self-pay | Admitting: Gynecology

## 2014-12-17 ENCOUNTER — Ambulatory Visit
Admission: EM | Admit: 2014-12-17 | Discharge: 2014-12-17 | Disposition: A | Discharge status: Home / Self Care | Payer: 59 | Attending: Family Medicine | Admitting: Family Medicine

## 2014-12-17 DIAGNOSIS — J40 Bronchitis, not specified as acute or chronic: Secondary | ICD-10-CM

## 2014-12-17 DIAGNOSIS — J209 Acute bronchitis, unspecified: Secondary | ICD-10-CM

## 2014-12-17 LAB — RAPID STREP SCREEN (MED CTR MEBANE ONLY): Streptococcus, Group A Screen (Direct): NEGATIVE

## 2014-12-17 MED ORDER — BENZONATATE 100 MG PO CAPS: 200.0000 mg | ORAL_CAPSULE | Freq: Three times a day (TID) | ORAL | Status: DC

## 2014-12-17 MED ORDER — HYDROCOD POLST-CPM POLST ER 10-8 MG/5ML PO SUER: 5.0000 mL | Freq: Two times a day (BID) | ORAL | Status: DC

## 2014-12-17 MED ORDER — SULFAMETHOXAZOLE-TRIMETHOPRIM 800-160 MG PO TABS: 1.0000 | ORAL_TABLET | Freq: Two times a day (BID) | ORAL | Status: DC

## 2014-12-17 MED ORDER — ALBUTEROL SULFATE HFA 108 (90 BASE) MCG/ACT IN AERS: 1.0000 | INHALATION_SPRAY | Freq: Four times a day (QID) | RESPIRATORY_TRACT | Status: DC | PRN

## 2014-12-17 MED ORDER — PREDNISONE 20 MG PO TABS: ORAL_TABLET | ORAL | Status: DC

## 2014-12-17 NOTE — ED Notes (Signed)
Patient c/o sore throat/ fever at home of 100.4 / coughing and congestion.

## 2014-12-17 NOTE — Discharge Instructions (Signed)
How to Use an Inhaler Proper inhaler technique is very important. Good technique ensures that the medicine reaches the lungs. Poor technique results in depositing the medicine on the tongue and back of the throat rather than in the airways. If you do not use the inhaler with good technique, the medicine will not help you. STEPS TO FOLLOW IF USING AN INHALER WITHOUT AN EXTENSION TUBE  Remove the cap from the inhaler.  If you are using the inhaler for the first time, you will need to prime it. Shake the inhaler for 5 seconds and release four puffs into the air, away from your face. Ask your health care provider or pharmacist if you have questions about priming your inhaler.  Shake the inhaler for 5 seconds before each breath in (inhalation).  Position the inhaler so that the top of the canister faces up.  Put your index finger on the top of the medicine canister. Your thumb supports the bottom of the inhaler.  Open your mouth.  Either place the inhaler between your teeth and place your lips tightly around the mouthpiece, or hold the inhaler 1-2 inches away from your open mouth. If you are unsure of which technique to use, ask your health care provider.  Breathe out (exhale) normally and as completely as possible.  Press the canister down with your index finger to release the medicine.  At the same time as the canister is pressed, inhale deeply and slowly until your lungs are completely filled. This should take 4-6 seconds. Keep your tongue down.  Hold the medicine in your lungs for 5-10 seconds (10 seconds is best). This helps the medicine get into the small airways of your lungs.  Breathe out slowly, through pursed lips. Whistling is an example of pursed lips.  Wait at least 15-30 seconds between puffs. Continue with the above steps until you have taken the number of puffs your health care provider has ordered. Do not use the inhaler more than your health care provider tells  you.  Replace the cap on the inhaler.  Follow the directions from your health care provider or the inhaler insert for cleaning the inhaler. STEPS TO FOLLOW IF USING AN INHALER WITH AN EXTENSION (SPACER)  Remove the cap from the inhaler.  If you are using the inhaler for the first time, you will need to prime it. Shake the inhaler for 5 seconds and release four puffs into the air, away from your face. Ask your health care provider or pharmacist if you have questions about priming your inhaler.  Shake the inhaler for 5 seconds before each breath in (inhalation).  Place the open end of the spacer onto the mouthpiece of the inhaler.  Position the inhaler so that the top of the canister faces up and the spacer mouthpiece faces you.  Put your index finger on the top of the medicine canister. Your thumb supports the bottom of the inhaler and the spacer.  Breathe out (exhale) normally and as completely as possible.  Immediately after exhaling, place the spacer between your teeth and into your mouth. Close your lips tightly around the spacer.  Press the canister down with your index finger to release the medicine.  At the same time as the canister is pressed, inhale deeply and slowly until your lungs are completely filled. This should take 4-6 seconds. Keep your tongue down and out of the way.  Hold the medicine in your lungs for 5-10 seconds (10 seconds is best). This helps the  medicine get into the small airways of your lungs. Exhale.  Repeat inhaling deeply through the spacer mouthpiece. Again hold that breath for up to 10 seconds (10 seconds is best). Exhale slowly. If it is difficult to take this second deep breath through the spacer, breathe normally several times through the spacer. Remove the spacer from your mouth.  Wait at least 15-30 seconds between puffs. Continue with the above steps until you have taken the number of puffs your health care provider has ordered. Do not use the  inhaler more than your health care provider tells you.  Remove the spacer from the inhaler, and place the cap on the inhaler.  Follow the directions from your health care provider or the inhaler insert for cleaning the inhaler and spacer. If you are using different kinds of inhalers, use your quick relief medicine to open the airways 10-15 minutes before using a steroid if instructed to do so by your health care provider. If you are unsure which inhalers to use and the order of using them, ask your health care provider, nurse, or respiratory therapist. If you are using a steroid inhaler, always rinse your mouth with water after your last puff, then gargle and spit out the water. Do not swallow the water. AVOID:  Inhaling before or after starting the spray of medicine. It takes practice to coordinate your breathing with triggering the spray.  Inhaling through the nose (rather than the mouth) when triggering the spray. HOW TO DETERMINE IF YOUR INHALER IS FULL OR NEARLY EMPTY You cannot know when an inhaler is empty by shaking it. A few inhalers are now being made with dose counters. Ask your health care provider for a prescription that has a dose counter if you feel you need that extra help. If your inhaler does not have a counter, ask your health care provider to help you determine the date you need to refill your inhaler. Write the refill date on a calendar or your inhaler canister. Refill your inhaler 7-10 days before it runs out. Be sure to keep an adequate supply of medicine. This includes making sure it is not expired, and that you have a spare inhaler.  SEEK MEDICAL CARE IF:   Your symptoms are only partially relieved with your inhaler.  You are having trouble using your inhaler.  You have some increase in phlegm. SEEK IMMEDIATE MEDICAL CARE IF:   You feel little or no relief with your inhalers. You are still wheezing and are feeling shortness of breath or tightness in your chest or  both.  You have dizziness, headaches, or a fast heart rate.  You have chills, fever, or night sweats.  You have a noticeable increase in phlegm production, or there is blood in the phlegm. MAKE SURE YOU:   Understand these instructions.  Will watch your condition.  Will get help right away if you are not doing well or get worse.   This information is not intended to replace advice given to you by your health care provider. Make sure you discuss any questions you have with your health care provider.   Document Released: 02/16/2000 Document Revised: 12/09/2012 Document Reviewed: 09/17/2012 Elsevier Interactive Patient Education 2016 Elsevier Inc[employer]  Upper Respiratory Infection, Adult Most upper respiratory infections (URIs) are a viral infection of the air passages leading to the lungs. A URI affects the nose, throat, and upper air passages. The most common type of URI is nasopharyngitis and is typically referred to as "the common cold."  URIs run their course and usually go away on their own. Most of the time, a URI does not require medical attention, but sometimes a bacterial infection in the upper airways can follow a viral infection. This is called a secondary infection. Sinus and middle ear infections are common types of secondary upper respiratory infections. Bacterial pneumonia can also complicate a URI. A URI can worsen asthma and chronic obstructive pulmonary disease (COPD). Sometimes, these complications can require emergency medical care and may be life threatening.  CAUSES Almost all URIs are caused by viruses. A virus is a type of germ and can spread from one person to another.  RISKS FACTORS You may be at risk for a URI if:   You smoke.   You have chronic heart or lung disease.  You have a weakened defense (immune) system.   You are very young or very old.   You have nasal allergies or asthma.  You work in crowded or poorly ventilated areas.  You work in health  care facilities or schools. SIGNS AND SYMPTOMS  Symptoms typically develop 2-3 days after you come in contact with a cold virus. Most viral URIs last 7-10 days. However, viral URIs from the influenza virus (flu virus) can last 14-18 days and are typically more severe. Symptoms may include:   Runny or stuffy (congested) nose.   Sneezing.   Cough.   Sore throat.   Headache.   Fatigue.   Fever.   Loss of appetite.   Pain in your forehead, behind your eyes, and over your cheekbones (sinus pain).  Muscle aches.  DIAGNOSIS  Your health care provider may diagnose a URI by:  Physical exam.  Tests to check that your symptoms are not due to another condition such as:  Strep throat.  Sinusitis.  Pneumonia.  Asthma. TREATMENT  A URI goes away on its own with time. It cannot be cured with medicines, but medicines may be prescribed or recommended to relieve symptoms. Medicines may help:  Reduce your fever.  Reduce your cough.  Relieve nasal congestion. HOME CARE INSTRUCTIONS   Take medicines only as directed by your health care provider.   Gargle warm saltwater or take cough drops to comfort your throat as directed by your health care provider.  Use a warm mist humidifier or inhale steam from a shower to increase air moisture. This may make it easier to breathe.  Drink enough fluid to keep your urine clear or pale yellow.   Eat soups and other clear broths and maintain good nutrition.   Rest as needed.   Return to work when your temperature has returned to normal or as your health care provider advises. You may need to stay home longer to avoid infecting others. You can also use a face mask and careful hand washing to prevent spread of the virus.  Increase the usage of your inhaler if you have asthma.   Do not use any tobacco products, including cigarettes, chewing tobacco, or electronic cigarettes. If you need help quitting, ask your health care  provider. PREVENTION  The best way to protect yourself from getting a cold is to practice good hygiene.   Avoid oral or hand contact with people with cold symptoms.   Wash your hands often if contact occurs.  There is no clear evidence that vitamin C, vitamin E, echinacea, or exercise reduces the chance of developing a cold. However, it is always recommended to get plenty of rest, exercise, and practice good nutrition.  SEEK MEDICAL CARE IF:  °· You are getting worse rather than better.   °· Your symptoms are not controlled by medicine.   °· You have chills. °· You have worsening shortness of breath. °· You have brown or red mucus. °· You have yellow or brown nasal discharge. °· You have pain in your face, especially when you bend forward. °· You have a fever. °· You have swollen neck glands. °· You have pain while swallowing. °· You have white areas in the back of your throat. °SEEK IMMEDIATE MEDICAL CARE IF:  °· You have severe or persistent: °¨ Headache. °¨ Ear pain. °¨ Sinus pain. °¨ Chest pain. °· You have chronic lung disease and any of the following: °¨ Wheezing. °¨ Prolonged cough. °¨ Coughing up blood. °¨ A change in your usual mucus. °· You have a stiff neck. °· You have changes in your: °¨ Vision. °¨ Hearing. °¨ Thinking. °¨ Mood. °MAKE SURE YOU:  °· Understand these instructions. °· Will watch your condition. °· Will get help right away if you are not doing well or get worse. °  °This information is not intended to replace advice given to you by your health care provider. Make sure you discuss any questions you have with your health care provider. °  °Document Released: 08/14/2000 Document Revised: 07/05/2014 Document Reviewed: 05/26/2013 °Elsevier Interactive Patient Education ©2016 Elsevier Inc. ° °

## 2014-12-17 NOTE — ED Provider Notes (Signed)
New Prescriptions   ALBUTEROL (PROVENTIL HFA;VENTOLIN HFA) 108 (90 BASE) MCG/ACT INHALER    Inhale 1-2 puffs into the lungs every 6 (six) hours as needed for wheezing or shortness of breath.   BENZONATATE (TESSALON) 100 MG CAPSULE    Take 2 capsules (200 mg total) by mouth every 8 (eight) hours.   CHLORPHENIRAMINE-HYDROCODONE (TUSSIONEX PENNKINETIC ER) 10-8 MG/5ML SUER    Take 5 mLs by mouth 2 (two) times daily.   PREDNISONE (DELTASONE) 20 MG TABLET    Take 2 tablets (40) mg daily for cough   SULFAMETHOXAZOLE-TRIMETHOPRIM (BACTRIM DS,SEPTRA DS) 800-160 MG TABLET    Take 1 tablet by mouth 2 (two) times daily.  CSN: [id number]     Arrival date & time 12/17/14  0807 History   First MD Initiated Contact with Patient 12/17/14 [phone]     Chief Complaint  Patient presents with  . 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 She has wheezing at nighttime. Addition she states that she was running a fever between 100 and 100.5. She's having tightness in her chest but not much sinus congestion or drainage.  History reviewed. No pertinent past medical history. Past Surgical History  Procedure Laterality Date  . Cesarean section    . Melanoma excision     Family History  Problem Relation Age of Onset  . Arthritis Mother   . Diabetes Mother   . Hypertension Father   . Heart disease Maternal Grandfather     CAD, MI  . Cancer Paternal Grandmother     melanoma  . Cancer Paternal Grandfather     lung   Social History  Substance Use Topics  . Smoking status: Never Smoker   . Smokeless tobacco: None  . Alcohol Use: No   OB History    No data available     Review of Systems  Constitutional:  Positive for fever and chills. Negative for diaphoresis and fatigue.  HENT: Positive for sore throat.   Respiratory: Positive for cough and wheezing.   All other systems reviewed and are negative.   Allergies  Review of patient's allergies indicates no known allergies.  Home Medications   Prior to Admission medications   Medication Sig Start Date End Date Taking? Authorizing Provider  calcium gluconate 500 MG tablet Take 500 mg by mouth daily.     Yes Historical Provider, MD  ergocalciferol (VITAMIN D2) 50000 UNITS capsule Take 50,000 Units by mouth once a week.     Yes Historical Provider, MD  Multiple Vitamin (MULTIVITAMIN) tablet Take 1 tablet by mouth daily.     Yes Historical Provider, MD  albuterol (PROVENTIL HFA;VENTOLIN HFA) 108 (90 BASE) MCG/ACT inhaler Inhale 1-2 puffs into the lungs every 6 (six) hours as needed for wheezing or shortness of breath. 12/17/14   Lorin Picket, PA-C  benzonatate (TESSALON) 100 MG capsule Take 2 capsules (200 mg total) by mouth every 8 (eight) hours. 12/17/14   Lorin Picket, PA-C  chlorpheniramine-HYDROcodone (TUSSIONEX PENNKINETIC ER) 10-8 MG/5ML SUER Take 5 mLs by mouth 2 (two) times daily. 12/17/14   Lorin Picket, PA-C  predniSONE (DELTASONE) 20 MG tablet Take 2 tablets (40) mg daily for cough 12/17/14   Lorin Picket, PA-C  sulfamethoxazole-trimethoprim (BACTRIM DS,SEPTRA DS) 800-160 MG  tablet Take 1 tablet by mouth 2 (two) times daily. 12/17/14   Lorin Picket, PA-C   Meds Ordered and Administered this Visit  Medications - No data to display  BP 136/97 mmHg  Pulse 75  Temp(Src) 98.4 F (36.9 C) (Oral)  Resp 18  Ht 5\' 11"  (1.803 m)  Wt 200 lb (90.719 kg)  BMI 27.91 kg/m2  SpO2 100%  LMP 11/19/2014 No data found.   Physical Exam  Constitutional: She is oriented to person, place, and time. She appears well-developed and well-nourished. No distress.  HENT:  Head: Normocephalic and atraumatic.  Right Ear: External ear  normal.  Left Ear: External ear normal.  Nose: Nose normal.  Mouth/Throat: Oropharynx is clear and moist.  Eyes: Pupils are equal, round, and reactive to light.  Neck: Neck supple.  Pulmonary/Chest: Effort normal and breath sounds normal. No stridor. No respiratory distress. She has no wheezes. She has no rales.  Musculoskeletal: Normal range of motion. She exhibits no edema or tenderness.  Lymphadenopathy:    She has no cervical adenopathy.  Neurological: She is alert and oriented to person, place, and time.  Skin: Skin is warm and dry. She is not diaphoretic.  Psychiatric: She has a normal mood and affect. Her behavior is normal. Judgment and thought content normal.  Nursing note and vitals reviewed.   ED Course  Procedures (including critical care time)  Labs Review Labs Reviewed  RAPID STREP SCREEN (NOT AT [facility])  CULTURE, GROUP A STREP ([facility] ONLY)    Imaging Review No results found.   Visual Acuity Review  Right Eye Distance:   Left Eye Distance:   Bilateral Distance:    Right Eye Near:   Left Eye Near:    Bilateral Near:         MDM   1. Bronchitis with bronchospasm    New Prescriptions   ALBUTEROL (PROVENTIL HFA;VENTOLIN HFA) 108 (90 BASE) MCG/ACT INHALER    Inhale 1-2 puffs into the lungs every 6 (six) hours as needed for wheezing or shortness of breath.   BENZONATATE (TESSALON) 100 MG CAPSULE    Take 2 capsules (200 mg total) by mouth every 8 (eight) hours.   CHLORPHENIRAMINE-HYDROCODONE (TUSSIONEX PENNKINETIC ER) 10-8 MG/5ML SUER    Take 5 mLs by mouth 2 (two) times daily.   PREDNISONE (DELTASONE) 20 MG TABLET    Take 2 tablets (40) mg daily for cough   SULFAMETHOXAZOLE-TRIMETHOPRIM (BACTRIM DS,SEPTRA DS) 800-160 MG TABLET    Take 1 tablet by mouth 2 (two) times daily.  Plan: 1.Diagnosis reviewed with patient 2. rx as per orders; risks, benefits, potential side effects reviewed with patient 3. Recommend supportive treatment with fluids,rest 4. F/u prn  if symptoms worsen or don't improve    Lorin Picket, PA-C 12/17/14 [phone]

## 2014-12-19 LAB — CULTURE, GROUP A STREP (THRC)

## 2015-03-10 ENCOUNTER — Ambulatory Visit
Admission: EM | Admit: 2015-03-10 | Discharge: 2015-03-10 | Disposition: A | Discharge status: Home / Self Care | Payer: 59 | Attending: Family Medicine | Admitting: Family Medicine

## 2015-03-10 DIAGNOSIS — J029 Acute pharyngitis, unspecified: Secondary | ICD-10-CM

## 2015-03-10 DIAGNOSIS — N390 Urinary tract infection, site not specified: Secondary | ICD-10-CM

## 2015-03-10 LAB — URINALYSIS COMPLETE WITH MICROSCOPIC (ARMC ONLY)
Glucose, UA: NEGATIVE mg/dL
HGB URINE DIPSTICK: NEGATIVE
LEUKOCYTES UA: NEGATIVE
NITRITE: NEGATIVE
PH: 5.5 (ref 5.0–8.0)
PROTEIN: NEGATIVE mg/dL

## 2015-03-10 LAB — RAPID STREP SCREEN (MED CTR MEBANE ONLY): Streptococcus, Group A Screen (Direct): NEGATIVE

## 2015-03-10 MED ORDER — AMOXICILLIN-POT CLAVULANATE 875-125 MG PO TABS: 1.0000 | ORAL_TABLET | Freq: Two times a day (BID) | ORAL | Status: DC

## 2015-03-10 NOTE — Discharge Instructions (Signed)
Take medication as prescribed. Rest. Eat and drink regularly. Take over-the-counter Tylenol or ibuprofen as needed for pain or fever. Drink plenty of fluids.   Follow-up with your primary care physician this week as needed. Return to urgent care as needed for new or worsening concerns.  Pharyngitis Pharyngitis is redness, pain, and swelling (inflammation) of your pharynx.  CAUSES  Pharyngitis is usually caused by infection. Most of the time, these infections are from viruses (viral) and are part of a cold. However, sometimes pharyngitis is caused by bacteria (bacterial). Pharyngitis can also be caused by allergies. Viral pharyngitis may be spread from person to person by coughing, sneezing, and personal items or utensils (cups, forks, spoons, toothbrushes). Bacterial pharyngitis may be spread from person to person by more intimate contact, such as kissing.  SIGNS AND SYMPTOMS  Symptoms of pharyngitis include:   Sore throat.   Tiredness (fatigue).   Low-grade fever.   Headache.  Joint pain and muscle aches.  Skin rashes.  Swollen lymph nodes.  Plaque-like film on throat or tonsils (often seen with bacterial pharyngitis). DIAGNOSIS  Your health care provider will ask you questions about your illness and your symptoms. Your medical history, along with a physical exam, is often all that is needed to diagnose pharyngitis. Sometimes, a rapid strep test is done. Other lab tests may also be done, depending on the suspected cause.  TREATMENT  Viral pharyngitis will usually get better in 3-4 days without the use of medicine. Bacterial pharyngitis is treated with medicines that kill germs (antibiotics).  HOME CARE INSTRUCTIONS   Drink enough water and fluids to keep your urine clear or pale yellow.   Only take over-the-counter or prescription medicines as directed by your health care provider:   If you are prescribed antibiotics, make sure you finish them even if you start to feel  better.   Do not take aspirin.   Get lots of rest.   Gargle with 8 oz of salt water ( tsp of salt per 1 qt of water) as often as every 1-2 hours to soothe your throat.   Throat lozenges (if you are not at risk for choking) or sprays may be used to soothe your throat. SEEK MEDICAL CARE IF:   You have large, tender lumps in your neck.  You have a rash.  You cough up green, yellow-brown, or bloody spit. SEEK IMMEDIATE MEDICAL CARE IF:   Your neck becomes stiff.  You drool or are unable to swallow liquids.  You vomit or are unable to keep medicines or liquids down.  You have severe pain that does not go away with the use of recommended medicines.  You have trouble breathing (not caused by a stuffy nose). MAKE SURE YOU:   Understand these instructions.  Will watch your condition.  Will get help right away if you are not doing well or get worse.   This information is not intended to replace advice given to you by your health care provider. Make sure you discuss any questions you have with your health care provider.   Document Released: 02/18/2005 Document Revised: 12/09/2012 Document Reviewed: 10/26/2012 Elsevier Interactive Patient Education 2016 Elsevier Inc[employer]  Urinary Tract Infection A urinary tract infection (UTI) can occur any place along the urinary tract. The tract includes the kidneys, ureters, bladder, and urethra. A type of germ called bacteria often causes a UTI. UTIs are often helped with antibiotic medicine.  HOME CARE   If given, take antibiotics as told by your doctor.  Finish them even if you start to feel better.  Drink enough fluids to keep your pee (urine) clear or pale yellow.  Avoid tea, drinks with caffeine, and bubbly (carbonated) drinks.  Pee often. Avoid holding your pee in for a long time.  Pee before and after having sex (intercourse).  Wipe from front to back after you poop (bowel movement) if you are a woman. Use each tissue only  once. GET HELP RIGHT AWAY IF:   You have back pain.  You have lower belly (abdominal) pain.  You have chills.  You feel sick to your stomach (nauseous).  You throw up (vomit).  Your burning or discomfort with peeing does not go away.  You have a fever.  Your symptoms are not better in 3 days. MAKE SURE YOU:   Understand these instructions.  Will watch your condition.  Will get help right away if you are not doing well or get worse.   This information is not intended to replace advice given to you by your health care provider. Make sure you discuss any questions you have with your health care provider.   Document Released: 08/07/2007 Document Revised: 03/11/2014 Document Reviewed: 09/19/2011 Elsevier Interactive Patient Education [employer].

## 2015-03-10 NOTE — ED Notes (Signed)
Exposed to +strep throat. Started yesterday with sore throat and nausea and fever 101.6.

## 2015-03-10 NOTE — ED Provider Notes (Signed)
[facility]  ____________________________________________  Time seen: Approximately 2:28 PM  I have reviewed the triage vital signs and the nursing notes.   HISTORY  Chief Complaint Sore Throat   HPI Candice [name] is a 40 y.o. female presents for the complaint of sore throat 4 days. Patient reports that she has also had some intermittent fever, and states fever maximum is 101.6 yesterday. Reports occasional nausea, denies current nausea. Denies vomiting. Reports has continued to eat and drink well.  States only thing bothering her at this time is a sore throat described as scratchy, itchy and irritating at 4/10. Patient reports that she has had multiple exposures to strep throat. Reports that her nieces as well as sister-in-law were recently around her just prior to her symptom onset. Reports that her nieces spent the night at her house for a couple days prior to her symptoms, and states that she did drink after them.  Patient reports that this morning she felt like she was urinating more frequently. Denies burning with urination. Denies abdominal pain or back pain. Patient reports that she has had urinary tract infections in the past and this was concerning as it felt similar. Denies vaginal discharge, vaginal odor, vaginal complaints. Denies concern for STDs.  Denies chest pain or shortness breath, abdominal pain, dizziness, weakness. Reports continues to eat and drink well.  PCP: [facility]   History reviewed. No pertinent past medical history.  Patient Active Problem List   Diagnosis Date Noted  . Tick bite 07/10/2010  . Fatigue 07/10/2010  . Otitis externa 07/10/2010  . PHARYNGITIS 05/08/2009  . ABDOMINAL PAIN, RIGHT LOWER QUADRANT 05/08/2009  . OVARIAN CYST 09/02/2008  . FATIGUE 09/02/2008  . WEIGHT GAIN, ABNORMAL 09/02/2008  . PERSONAL HISTORY OF MALIGNANT MELANOMA OF SKIN 09/02/2008    Past Surgical History  Procedure Laterality Date  . Cesarean section     . Melanoma excision     Last menstrual.: 2 weeks ago. Denies chance of pregnancy.  Current Outpatient Rx  Name  Route  Sig  Dispense  Refill  .           .           .           .           .           .           .           .           .             Allergies Review of patient's allergies indicates no known allergies.  Family History  Problem Relation Age of Onset  . Arthritis Mother   . Diabetes Mother   . Hypertension Father   . Heart disease Maternal Grandfather     CAD, MI  . Cancer Paternal Grandmother     melanoma  . Cancer Paternal Grandfather     lung    Social History Social History  Substance Use Topics  . Smoking status: Never Smoker   . Smokeless tobacco: None  . Alcohol Use: No    Review of Systems Constitutional: Positive subjective fevers. Eyes: No visual changes. ENT: Positive sore throat. Denies cough, congestion or runny nose. Cardiovascular: Denies chest pain. Respiratory: Denies shortness of breath. Gastrointestinal: No abdominal pain.  No nausea, no vomiting.  No diarrhea.  No constipation. Genitourinary: Positive for  dysuria. Musculoskeletal: Negative for back pain. Skin: Negative for rash. Neurological: Negative for headaches, focal weakness or numbness.  10-point ROS otherwise negative.  ____________________________________________   PHYSICAL EXAM:  VITAL SIGNS: ED Triage Vitals  Enc Vitals Group     BP 03/10/15 1310 151/86 mmHg     Pulse Rate 03/10/15 1310 76     Resp 03/10/15 1310 16     Temp 03/10/15 1310 98.6 F (37 C)     Temp Source 03/10/15 1310 Tympanic     SpO2 03/10/15 1310 100 %     Weight 03/10/15 1310 200 lb (90.719 kg)     Height 03/10/15 1310 5\' 11"  (1.803 m)     Head Cir --      Peak Flow --      Pain Score 03/10/15 1314 0     Pain Loc --      Pain Edu? --      Excl. in [place]? --     Constitutional: Alert and oriented. Well appearing and in no acute distress. Eyes: Conjunctivae are normal.  PERRL. EOMI. Head: Atraumatic. Nontender. No erythema. No swelling.  Ears: no erythema, normal TMs bilaterally.   Nose: No congestion/rhinnorhea.  Mouth/Throat: Mucous membranes are moist.  Moderate pharyngeal erythema. No tonsillar swelling or exudate. Neck: No stridor.  No cervical spine tenderness to palpation. Hematological/Lymphatic/Immunilogical: No cervical lymphadenopathy. Cardiovascular: Normal rate, regular rhythm. Grossly normal heart sounds.  Good peripheral circulation. Respiratory: Normal respiratory effort.  No retractions. Lungs CTAB. No wheezes, rales or rhonchi. Gastrointestinal: Soft and nontender. No distention. Normal Bowel sounds. No CVA tenderness. Musculoskeletal: No lower or upper extremity tenderness nor edema.Bilateral pedal pulses equal and easily palpated.  Neurologic:  Normal speech and language. No gross focal neurologic deficits are appreciated. No gait instability. Skin:  Skin is warm, dry and intact. No rash noted. Psychiatric: Mood and affect are normal. Speech and behavior are normal.  ____________________________________________   LABS (all labs ordered are listed, but only abnormal results are displayed)  Labs Reviewed  URINALYSIS COMPLETEWITH MICROSCOPIC ([place]) - Abnormal; Notable for the following:    Bilirubin Urine 1+ (*)    Ketones, ur 1+ (*)    Specific Gravity, Urine >1.030 (*)    Bacteria, UA FEW (*)    Squamous Epithelial / LPF 6-30 (*)    All other components within normal limits  RAPID STREP SCREEN (NOT AT [facility])  CULTURE, GROUP A STREP ([facility] ONLY)    INITIAL IMPRESSION / ASSESSMENT AND PLAN / ED COURSE  Pertinent labs & imaging results that were available during my care of the patient were reviewed by me and considered in my medical decision making (see chart for details).  Very well-appearing patient. No acute distress. Presents for the complaints of sore throat intermittent fevers in the last 4 days. Denies cough,  congestion, runny nose. Reports positive strep throat exposures for multiple other family members just prior to symptom onset for patient. Patient also reports some increased urinary frequency this morning , denies other urinary changes. Denies abdominal pain. Denies back pain. Lungs clear throughout. Abdomen soft and nontender. No CVA tenderness. Quick strep negative, will culture. Will evaluate urinalysis.   As patient in absence of runny nose, congestion, cough as well as with pharyngeal erythema and subjective fevers suspect streptococcal pharyngitis and will treat. Urinalysis positive for few bacteria, negative leukocytes with 6-30 squamous epithelial cells, will culture urine.   Will treat pharyngitis new tract infection with oral Augmentin. Encouraged rest, fluids, over-the-counter  Tylenol or ibuprofen as needed. PCP follow-up.  Discussed follow up with Primary care physician this week. Discussed follow up and return parameters including no resolution or any worsening concerns. Patient verbalized understanding and agreed to plan.   ____________________________________________   FINAL CLINICAL IMPRESSION(S) / ED DIAGNOSES  Final diagnoses:  UTI (lower urinary tract infection)  Pharyngitis       Marylene Land, NP 03/10/15 1451

## 2015-03-12 LAB — CULTURE, GROUP A STREP (THRC)

## 2015-03-12 LAB — URINE CULTURE: SPECIAL REQUESTS: NORMAL

## 2015-03-13 ENCOUNTER — Telehealth: Payer: Self-pay

## 2015-03-13 NOTE — ED Notes (Signed)
This nurse attempted to contact patient regarding negative throat and urine culture. 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

## 2015-08-18 ENCOUNTER — Ambulatory Visit (INDEPENDENT_AMBULATORY_CARE_PROVIDER_SITE_OTHER): Payer: 59 | Admitting: Physician Assistant

## 2015-08-18 VITALS — BP 124/80 | HR 107 | Temp 98.3°F | Resp 17 | Ht 70.5 in | Wt 243.0 lb

## 2015-08-18 DIAGNOSIS — M940 Chondrocostal junction syndrome [Tietze]: Secondary | ICD-10-CM

## 2015-08-18 MED ORDER — MELOXICAM 7.5 MG PO TABS: 7.5000 mg | ORAL_TABLET | Freq: Two times a day (BID) | ORAL | Status: DC

## 2015-08-18 NOTE — Progress Notes (Signed)
Candice [name]  MRN: [id number] DOB: June 24, 1975  Subjective:  Pt presents to clinic with chest pain for about the last few weeks.  She started about a month ago with bad allergies which turned into an ear infection and then tonsillitis.  1 month ago - clindamycin for tonsillitis.  When she was having problems with her allergies she developed a cough which has gotten slightly better but she is having pain in her right side of chest when she takes a deep breath or coughs.  Her coughs are not productive and she is not having SOB or wheezing.  She has an inhaler that has not helped with the pain.  She has some pain with movement - the other night she rolled over in bed and the pain was really bad.  She has had no fevers or chills but she has not felt great this week though there is no specific symptoms this week.  Pt has used some motrin but she did not get pain relief.  Patient Active Problem List   Diagnosis Date Noted  . PERSONAL HISTORY OF MALIGNANT MELANOMA OF SKIN 09/02/2008    Current Outpatient Prescriptions on File Prior to Visit  Medication Sig Dispense Refill  . albuterol (PROVENTIL HFA;VENTOLIN HFA) 108 (90 BASE) MCG/ACT inhaler Inhale 1-2 puffs into the lungs every 6 (six) hours as needed for wheezing or shortness of breath. 1 Inhaler 0  . ergocalciferol (VITAMIN D2) 50000 UNITS capsule Take 50,000 Units by mouth once a week.      . Multiple Vitamin (MULTIVITAMIN) tablet Take 1 tablet by mouth daily.       No current facility-administered medications on file prior to visit.    No Known Allergies  Review of Systems  Constitutional: Positive for chills (1 episode ). Negative for fever.  Respiratory: Positive for cough (rare). Negative for shortness of breath and wheezing.        No h/o asthma, nonsmoker  Cardiovascular: Positive for chest pain.  Allergic/Immunologic: Positive for environmental allergies.   Objective:  BP 124/80 mmHg  Pulse 107  Temp(Src) 98.3 F (36.8  C) (Oral)  Resp 17  Ht 5' 10.5" (1.791 m)  Wt 243 lb (110.224 kg)  BMI 34.36 kg/m2  SpO2 96%  LMP 08/16/2015  Physical Exam  Constitutional: She is oriented to person, place, and time and well-developed, well-nourished, and in no distress.  HENT:  Head: Normocephalic and atraumatic.  Right Ear: Hearing, tympanic membrane, external ear and ear canal normal.  Left Ear: Hearing, tympanic membrane, external ear and ear canal normal.  Nose: Nose normal.  Mouth/Throat: Uvula is midline, oropharynx is clear and moist and mucous membranes are normal.  Eyes: Conjunctivae are normal.  Neck: Normal range of motion.  Cardiovascular: Normal rate, regular rhythm and normal heart sounds.  Exam reveals no friction rub.   No murmur heard. Pulmonary/Chest: Effort normal and breath sounds normal. No accessory muscle usage. No respiratory distress. She has no wheezes. She has no rales. She exhibits bony tenderness.    Neurological: She is alert and oriented to person, place, and time. Gait normal.  Skin: Skin is warm and dry.  Psychiatric: Mood, memory, affect and judgment normal.  Vitals reviewed.   Assessment and Plan :  Costochondritis - Plan: meloxicam (MOBIC) 7.5 MG tablet pt has a normal chest exam and the pain is reproducible. We will treat with NSAIDs and time - if her symptoms changes she will RTC.  She agrees and understands the plan.  Windell Hummingbird PA-C  [facility] 08/18/2015 11:58 AM

## 2015-08-18 NOTE — Patient Instructions (Addendum)
  Costochondritis Costochondritis, sometimes called Tietze syndrome, is a swelling and irritation (inflammation) of the tissue (cartilage) that connects your ribs with your breastbone (sternum). It causes pain in the chest and rib area. Costochondritis usually goes away on its own over time. It can take up to 6 weeks or longer to get better, especially if you are unable to limit your activities. CAUSES  Some cases of costochondritis have no known cause. Possible causes include:  Injury (trauma).  Exercise or activity such as lifting.  Severe coughing. SIGNS AND SYMPTOMS  Pain and tenderness in the chest and rib area.  Pain that gets worse when coughing or taking deep breaths.  Pain that gets worse with specific movements. DIAGNOSIS  Your health care provider will do a physical exam and ask about your symptoms. Chest X-rays or other tests may be done to rule out other problems. TREATMENT  Costochondritis usually goes away on its own over time. Your health care provider may prescribe medicine to help relieve pain. HOME CARE INSTRUCTIONS   Avoid exhausting physical activity. Try not to strain your ribs during normal activity. This would include any activities using chest, abdominal, and side muscles, especially if heavy weights are used.  Apply ice to the affected area for the first 2 days after the pain begins.  Put ice in a plastic bag.  Place a towel between your skin and the bag.  Leave the ice on for 20 minutes, 2-3 times a day.  Only take over-the-counter or prescription medicines as directed by your health care provider. SEEK MEDICAL CARE IF:  You have redness or swelling at the rib joints. These are signs of infection.  Your pain does not go away despite rest or medicine. SEEK IMMEDIATE MEDICAL CARE IF:   Your pain increases or you are very uncomfortable.  You have shortness of breath or difficulty breathing.  You cough up blood.  You have worse chest pains,  sweating, or vomiting.  You have a fever or persistent symptoms for more than 2-3 days.  You have a fever and your symptoms suddenly get worse. MAKE SURE YOU:   Understand these instructions.  Will watch your condition.  Will get help right away if you are not doing well or get worse.   This information is not intended to replace advice given to you by your health care provider. Make sure you discuss any questions you have with your health care provider.   Document Released: 11/28/2004 Document Revised: 12/09/2012 Document Reviewed: 09/22/2012 Elsevier Interactive Patient Education 2016 [employer].    IF you received an x-ray today, you will receive an invoice from [facility]. Please contact [facility] at [phone] with questions or concerns regarding your invoice.   IF you received labwork today, you will receive an invoice from [employer]. Please contact [facility] at [phone] with questions or concerns regarding your invoice.   Our billing staff will not be able to assist you with questions regarding bills from these companies.  You will be contacted with the lab results as soon as they are available. The fastest way to get your results is to activate your My Chart account. Instructions are located on the last page of this paperwork. If you have not heard from [place] regarding the results in 2 weeks, please contact this office.

## 2015-08-21 ENCOUNTER — Ambulatory Visit (INDEPENDENT_AMBULATORY_CARE_PROVIDER_SITE_OTHER): Payer: 59 | Admitting: Family Medicine

## 2015-08-21 ENCOUNTER — Ambulatory Visit (INDEPENDENT_AMBULATORY_CARE_PROVIDER_SITE_OTHER): Payer: 59

## 2015-08-21 VITALS — BP 118/86 | HR 80 | Temp 98.5°F | Resp 16 | Ht 68.75 in | Wt 243.2 lb

## 2015-08-21 DIAGNOSIS — R112 Nausea with vomiting, unspecified: Secondary | ICD-10-CM

## 2015-08-21 DIAGNOSIS — R0781 Pleurodynia: Secondary | ICD-10-CM

## 2015-08-21 DIAGNOSIS — R079 Chest pain, unspecified: Secondary | ICD-10-CM | POA: Diagnosis not present

## 2015-08-21 LAB — COMPREHENSIVE METABOLIC PANEL
ALBUMIN: 4.3 g/dL (ref 3.6–5.1)
ALK PHOS: 91 U/L (ref 33–115)
ALT: 16 U/L (ref 6–29)
AST: 14 U/L (ref 10–30)
BILIRUBIN TOTAL: 0.5 mg/dL (ref 0.2–1.2)
BUN: 14 mg/dL (ref 7–25)
CALCIUM: 9.5 mg/dL (ref 8.6–10.2)
CO2: 29 mmol/L (ref 20–31)
CREATININE: 1.07 mg/dL (ref 0.50–1.10)
Chloride: 97 mmol/L — ABNORMAL LOW (ref 98–110)
Glucose, Bld: 100 mg/dL — ABNORMAL HIGH (ref 65–99)
Potassium: 3.9 mmol/L (ref 3.5–5.3)
SODIUM: 137 mmol/L (ref 135–146)
TOTAL PROTEIN: 7 g/dL (ref 6.1–8.1)

## 2015-08-21 LAB — POCT URINALYSIS DIP (MANUAL ENTRY)
BILIRUBIN UA: NEGATIVE
Bilirubin, UA: NEGATIVE
GLUCOSE UA: NEGATIVE
LEUKOCYTES UA: NEGATIVE
Nitrite, UA: NEGATIVE
PROTEIN UA: NEGATIVE
Spec Grav, UA: 1.015
Urobilinogen, UA: 0.2
pH, UA: 7

## 2015-08-21 LAB — POCT CBC
Granulocyte percent: 73.8 %G (ref 37–80)
HCT, POC: 41.7 % (ref 37.7–47.9)
Hemoglobin: 14.2 g/dL (ref 12.2–16.2)
LYMPH, POC: 1.9 (ref 0.6–3.4)
MCH, POC: 28.3 pg (ref 27–31.2)
MCHC: 33.9 g/dL (ref 31.8–35.4)
MCV: 83.5 fL (ref 80–97)
MID (CBC): 0.8 (ref 0–0.9)
MPV: 7.2 fL (ref 0–99.8)
PLATELET COUNT, POC: 232 10*3/uL (ref 142–424)
POC Granulocyte: 7.6 — AB (ref 2–6.9)
POC LYMPH %: 18.7 % (ref 10–50)
POC MID %: 7.5 %M (ref 0–12)
RBC: 4.99 M/uL (ref 4.04–5.48)
RDW, POC: 13.8 %
WBC: 10.3 10*3/uL — AB (ref 4.6–10.2)

## 2015-08-21 LAB — POC MICROSCOPIC URINALYSIS (UMFC): Mucus: ABSENT

## 2015-08-21 MED ORDER — ONDANSETRON 4 MG PO TBDP: 4.0000 mg | ORAL_TABLET | Freq: Three times a day (TID) | ORAL | Status: DC | PRN

## 2015-08-21 MED ORDER — ONDANSETRON 4 MG PO TBDP
8.0000 mg | ORAL_TABLET | Freq: Once | ORAL | Status: AC
Start: 2015-08-21 — End: 2015-08-21
  Administered 2015-08-21: 8 mg via ORAL

## 2015-08-21 MED ORDER — ALBUTEROL SULFATE HFA 108 (90 BASE) MCG/ACT IN AERS: 1.0000 | INHALATION_SPRAY | Freq: Four times a day (QID) | RESPIRATORY_TRACT | Status: DC | PRN

## 2015-08-21 MED ORDER — ALBUTEROL SULFATE (2.5 MG/3ML) 0.083% IN NEBU
2.5000 mg | INHALATION_SOLUTION | Freq: Once | RESPIRATORY_TRACT | Status: AC
  Administered 2015-08-21: 2.5 mg via RESPIRATORY_TRACT

## 2015-08-21 MED ORDER — HYDROCOD POLST-CPM POLST ER 10-8 MG/5ML PO SUER: 5.0000 mL | Freq: Two times a day (BID) | ORAL | Status: DC | PRN

## 2015-08-21 MED ORDER — AZITHROMYCIN 500 MG PO TABS: 500.0000 mg | ORAL_TABLET | Freq: Every day | ORAL | Status: DC

## 2015-08-21 NOTE — Patient Instructions (Signed)

## 2015-08-21 NOTE — Progress Notes (Signed)
By signing my name below, I, Mesha Guinyard, attest that this documentation has been prepared under the direction and in the presence of Delman Cheadle, MD.  Electronically Signed: Verlee Monte, Medical Scribe. 08/21/2015. 2:08 PM.  Subjective:    Patient ID: Candice [name], female    DOB: 1976/01/13, 40 y.o.   MRN: [id number]  Flank Pain Pertinent negatives include no chest pain or dysuria.  Emesis  Associated symptoms include chills and coughing. Pertinent negatives include no chest pain or diarrhea.   Chief Complaint  Patient presents with  . Flank Pain    DX on Friday with costochondritis not better  . other    states she is still not feeling better  . Emesis    HPI Comments: Candice [name] is a 40 y.o. female who presents to the [facility] complaining of 2 emesis episodes last night. Pt was seen 3 days ago and was dx with Costochondritis and was put on Meloxicam. Pt mentions she had tonsillitis 2 months ago and was put on Clindamycin. Pt mentions it hurts to take deep breaths. Pt reports associated symptoms of productive cough with white sputum, tachycardia with diaphoresis, chills, appetite loss, and fatigue. Pt took Tylenol with little relief to her symptoms. Pt denies irregular bm, chest tightness, extremity edema, dysuria, frequency, difficulty urinating, and back pain.  Pt is still on her menses. 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  Patient Active Problem List   Diagnosis Date Noted  . PERSONAL HISTORY OF MALIGNANT MELANOMA OF SKIN 09/02/2008   No past medical history on file. Past Surgical History  Procedure Laterality Date  . Cesarean section    . Melanoma excision     No Known Allergies Prior to Admission medications   Medication Sig Start Date End Date Taking? Authorizing Provider  albuterol (PROVENTIL HFA;VENTOLIN HFA) 108 (90 BASE) MCG/ACT inhaler Inhale 1-2 puffs into the lungs every 6 (six) hours as needed for wheezing or  shortness of breath. 12/17/14  Yes Lorin Picket, PA-C  ergocalciferol (VITAMIN D2) 50000 UNITS capsule Take 50,000 Units by mouth once a week.     Yes Historical Provider, MD  meloxicam (MOBIC) 7.5 MG tablet Take 1 tablet (7.5 mg total) by mouth 2 (two) times daily. 08/18/15  Yes Mancel Bale, PA-C  Multiple Vitamin (MULTIVITAMIN) tablet Take 1 tablet by mouth daily.     Yes Historical Provider, MD   Social History   Social History  . Marital Status: Married    Spouse Name: N/A  . Number of Children: 2  . Years of Education: N/A   Occupational History  . RN    Social History Main Topics  . Smoking status: Never Smoker   . Smokeless tobacco: Not on file  . Alcohol Use: No  . Drug Use: No  . Sexual Activity: Not on file   Other Topics Concern  . Not on file   Social History Narrative   Regular exercise-yes bike, walking 3 times a week      Diet vegetarian, fish   Depression screen [facility] 2/9 08/21/2015 08/18/2015  Decreased Interest 0 0  Down, Depressed, Hopeless 0 0  PHQ - 2 Score 0 0    Review of Systems  Constitutional: Positive for chills, diaphoresis, appetite change (loss) and fatigue.  Respiratory: Positive for cough. Negative for chest tightness, shortness of breath and wheezing.   Cardiovascular: Positive for palpitations (when she was sweaty). Negative for chest pain and leg swelling (or  ams).  Gastrointestinal: Positive for vomiting. Negative for diarrhea and constipation.  Genitourinary: Negative for dysuria, urgency, frequency and difficulty urinating.  Musculoskeletal: Negative for back pain.    Objective:  BP 118/86 mmHg  Pulse 80  Temp(Src) 98.5 F (36.9 C) (Oral)  Resp 16  Ht 5' 8.75" (1.746 m)  Wt 243 lb 3.2 oz (110.315 kg)  BMI 36.19 kg/m2  SpO2 98%  LMP 08/16/2015  Physical Exam  Constitutional: She appears well-developed and well-nourished. No distress.  HENT:  Head: Normocephalic and atraumatic.  Right Ear: Tympanic membrane normal.    Left Ear: Tympanic membrane normal.  Mouth/Throat: Oropharynx is clear and moist.  Nares nl No cervical adenopathy  Eyes: Conjunctivae are normal.  Neck: Neck supple. No thyroid mass and no thyromegaly present.  Cardiovascular: Normal rate, regular rhythm, S1 normal and S2 normal.  Exam reveals no gallop and no friction rub.   No murmur heard. Pulmonary/Chest: Effort normal.  Left lung clear Right base with decreased air movement Right upper lobe with bronchial air sounds   Abdominal: Bowel sounds are normal.  Neurological: She is alert.  Skin: Skin is warm and dry.  Psychiatric: She has a normal mood and affect. Her behavior is normal.  Nursing note and vitals reviewed.  Results for orders placed or performed in visit on 08/21/15  POCT CBC  Result Value Ref Range   WBC 10.3 (A) 4.6 - 10.2 K/uL   Lymph, poc 1.9 0.6 - 3.4   POC LYMPH PERCENT 18.7 10 - 50 %L   MID (cbc) 0.8 0 - 0.9   POC MID % 7.5 0 - 12 %M   POC Granulocyte 7.6 (A) 2 - 6.9   Granulocyte percent 73.8 37 - 80 %G   RBC 4.99 4.04 - 5.48 M/uL   Hemoglobin 14.2 12.2 - 16.2 g/dL   HCT, POC 41.7 37.7 - 47.9 %   MCV 83.5 80 - 97 fL   MCH, POC 28.3 27 - 31.2 pg   MCHC 33.9 31.8 - 35.4 g/dL   RDW, POC 13.8 %   Platelet Count, POC 232 142 - 424 K/uL   MPV 7.2 0 - 99.8 fL  POCT Microscopic Urinalysis ([facility])  Result Value Ref Range   WBC,UR,HPF,POC None None WBC/hpf   RBC,UR,HPF,POC Few (A) None RBC/hpf   Bacteria None None, Too numerous to count   Mucus Absent Absent   Epithelial Cells, UR Per Microscopy Few (A) None, Too numerous to count cells/hpf  POCT urinalysis dipstick  Result Value Ref Range   Color, UA yellow yellow   Clarity, UA clear clear   Glucose, UA negative negative   Bilirubin, UA negative negative   Ketones, POC UA negative negative   Spec Grav, UA 1.015    Blood, UA moderate (A) negative   pH, UA 7.0    Protein Ur, POC negative negative   Urobilinogen, UA 0.2    Nitrite, UA Negative  Negative   Leukocytes, UA Negative Negative   EKG Reading: Nl sinus rhythm; Question of left atrial enlargement vs lead positioning - copy gave to pt so she can f/u with her PCP to have repeated. No signs of strain otherwise, normal T waves, no S1Q3T3.  Dg Chest 2 View  08/21/2015  CLINICAL DATA:  Cough.  Chest pain . EXAM: CHEST  2 VIEW COMPARISON:  05/29/2005 . FINDINGS: No acute cardiopulmonary disease. No focal infiltrate. No pleural effusion or pneumothorax. Thoracic spine scoliosis. IMPRESSION: No acute cardiopulmonary disease. Electronically Signed  By: [place]   On: 08/21/2015 14:30   Assessment & Plan:   1. Right-sided chest pain   2. Non-intractable vomiting with nausea, unspecified vomiting type   3. Chest pain, pleuritic   Pt with good story for costochondritis considering she has had several episodes of URIs on Augmentin and clinda.  Will cover for atypical walking pneumonia - azithro.  Encouraged pt to use the tussionex and albuterol so that she can cont deep breathing and coughing to avoid further infection.  Recheck in 3d unless sig improving.     Orders Placed This Encounter  Procedures  . DG Chest 2 View    Standing Status: Future     Number of Occurrences: 1     Standing Expiration Date: 08/20/2016    Order Specific Question:  Reason for Exam (SYMPTOM  OR DIAGNOSIS REQUIRED)    Answer:  right-sided chest pain with cough, deceased breath sounds on right    Order Specific Question:  Is the patient pregnant?    Answer:  No    Order Specific Question:  Preferred imaging location?    Answer:  External  . Comprehensive metabolic panel  . POCT CBC  . POCT Microscopic Urinalysis ([facility])  . POCT urinalysis dipstick  . EKG 12-Lead    Meds ordered this encounter  Medications  . ondansetron (ZOFRAN-ODT) disintegrating tablet 8 mg    Sig:   . albuterol (PROVENTIL) (2.5 MG/3ML) 0.083% nebulizer solution 2.5 mg    Sig:   . azithromycin (ZITHROMAX) 500 MG tablet     Sig: Take 1 tablet (500 mg total) by mouth daily.    Dispense:  3 tablet    Refill:  0  . ondansetron (ZOFRAN ODT) 4 MG disintegrating tablet    Sig: Take 1 tablet (4 mg total) by mouth every 8 (eight) hours as needed for nausea or vomiting.    Dispense:  20 tablet    Refill:  0  . chlorpheniramine-HYDROcodone (TUSSIONEX PENNKINETIC ER) 10-8 MG/5ML SUER    Sig: Take 5 mLs by mouth every 12 (twelve) hours as needed.    Dispense:  120 mL    Refill:  0  . albuterol (PROVENTIL HFA;VENTOLIN HFA) 108 (90 Base) MCG/ACT inhaler    Sig: Inhale 1-2 puffs into the lungs every 6 (six) hours as needed for wheezing or shortness of breath.    Dispense:  1 Inhaler    Refill:  0    I personally performed the services described in this documentation, which was scribed in my presence. The recorded information has been reviewed and considered, and addended by me as needed.   Delman Cheadle, M.D.  [facility] [address] [place], [place] [address] [phone] phone [phone] fax  08/21/2015 7:24 PM

## 2015-08-22 ENCOUNTER — Encounter: Payer: Self-pay | Admitting: Family Medicine

## 2015-08-29 ENCOUNTER — Encounter: Payer: Self-pay | Admitting: Family Medicine

## 2016-07-15 ENCOUNTER — Other Ambulatory Visit: Payer: Self-pay | Admitting: Otolaryngology

## 2017-07-13 HISTORY — PX: TONSILLECTOMY AND ADENOIDECTOMY: SHX28

## 2017-08-11 HISTORY — PX: COLONOSCOPY WITH ESOPHAGOGASTRODUODENOSCOPY (EGD): SHX5779

## 2018-01-16 ENCOUNTER — Other Ambulatory Visit: Payer: Self-pay | Admitting: Family Medicine

## 2018-01-16 DIAGNOSIS — M542 Cervicalgia: Secondary | ICD-10-CM

## 2018-01-16 DIAGNOSIS — R51 Headache: Secondary | ICD-10-CM

## 2018-01-16 DIAGNOSIS — R519 Headache, unspecified: Secondary | ICD-10-CM

## 2018-05-20 ENCOUNTER — Telehealth: Payer: 59 | Admitting: Family

## 2018-05-20 DIAGNOSIS — J0100 Acute maxillary sinusitis, unspecified: Secondary | ICD-10-CM | POA: Diagnosis not present

## 2018-05-20 MED ORDER — AMOXICILLIN-POT CLAVULANATE 875-125 MG PO TABS: 1.0000 | ORAL_TABLET | Freq: Two times a day (BID) | ORAL | 0 refills | Status: DC

## 2018-05-20 NOTE — Progress Notes (Signed)
We are sorry that you are not feeling well.  Here is how we plan to help!  Approximately 5 minutes was spent documenting and reviewing patient's chart.    Based on what you have shared with me it looks like you have sinusitis.  Sinusitis is inflammation and infection in the sinus cavities of the head.  Based on your presentation I believe you most likely have Acute Bacterial Sinusitis.  This is an infection caused by bacteria and is treated with antibiotics. I have prescribed Augmentin 875mg /125mg  one tablet twice daily with food, for 7 days. You may use an oral decongestant such as Mucinex D or if you have glaucoma or high blood pressure use plain Mucinex. Saline nasal spray help and can safely be used as often as needed for congestion.  If you develop worsening sinus pain, fever or notice severe headache and vision changes, or if symptoms are not better after completion of antibiotic, please schedule an appointment with a health care provider.    Sinus infections are not as easily transmitted as other respiratory infection, however we still recommend that you avoid close contact with loved ones, especially the very young and elderly.  Remember to wash your hands thoroughly throughout the day as this is the number one way to prevent the spread of infection!  Home Care:  Only take medications as instructed by your medical team.  Complete the entire course of an antibiotic.  Do not take these medications with alcohol.  A steam or ultrasonic humidifier can help congestion.  You can place a towel over your head and breathe in the steam from hot water coming from a faucet.  Avoid close contacts especially the very young and the elderly.  Cover your mouth when you cough or sneeze.  Always remember to wash your hands.  Get Help Right Away If:  You develop worsening fever or sinus pain.  You develop a severe head ache or visual changes.  Your symptoms persist after you have completed your  treatment plan.  Make sure you  Understand these instructions.  Will watch your condition.  Will get help right away if you are not doing well or get worse.  Your e-visit answers were reviewed by a board certified advanced clinical practitioner to complete your personal care plan.  Depending on the condition, your plan could have included both over the counter or prescription medications.  If there is a problem please reply  once you have received a response from your provider.  Your safety is important to [place].  If you have drug allergies check your prescription carefully.    You can use MyChart to ask questions about today's visit, request a non-urgent call back, or ask for a work or school excuse for 24 hours related to this e-Visit. If it has been greater than 24 hours you will need to follow up with your provider, or enter a new e-Visit to address those concerns.  You will get an e-mail in the next two days asking about your experience.  I hope that your e-visit has been valuable and will speed your recovery. Thank you for using e-visits.

## 2018-07-24 ENCOUNTER — Other Ambulatory Visit: Payer: Self-pay

## 2018-07-24 ENCOUNTER — Ambulatory Visit
Admission: RE | Admit: 2018-07-24 | Discharge: 2018-07-24 | Disposition: A | Discharge status: Home / Self Care | Payer: 59 | Source: Ambulatory Visit | Attending: Family Medicine | Admitting: Family Medicine

## 2018-07-24 DIAGNOSIS — R519 Headache, unspecified: Secondary | ICD-10-CM

## 2018-07-24 DIAGNOSIS — M542 Cervicalgia: Secondary | ICD-10-CM

## 2018-09-03 ENCOUNTER — Other Ambulatory Visit: Payer: Self-pay | Admitting: Emergency Medicine

## 2018-09-07 ENCOUNTER — Other Ambulatory Visit: Payer: Self-pay | Admitting: Family Medicine

## 2018-09-07 DIAGNOSIS — M50122 Cervical disc disorder at C5-C6 level with radiculopathy: Secondary | ICD-10-CM

## 2018-10-07 ENCOUNTER — Ambulatory Visit
Admission: RE | Admit: 2018-10-07 | Discharge: 2018-10-07 | Disposition: A | Discharge status: Home / Self Care | Payer: 59 | Source: Ambulatory Visit | Attending: Family Medicine | Admitting: Family Medicine

## 2018-10-07 ENCOUNTER — Other Ambulatory Visit: Payer: Self-pay

## 2018-10-07 DIAGNOSIS — M50122 Cervical disc disorder at C5-C6 level with radiculopathy: Secondary | ICD-10-CM

## 2018-10-09 ENCOUNTER — Other Ambulatory Visit: Payer: Self-pay

## 2018-10-10 LAB — LIPID PANEL W/O CHOL/HDL RATIO
Cholesterol, Total: 237 mg/dL — ABNORMAL HIGH (ref 100–199)
HDL: 52 mg/dL (ref >39)
LDL Calculated: 159 mg/dL — ABNORMAL HIGH (ref 0–99)
Triglycerides: 128 mg/dL (ref 0–149)
VLDL Cholesterol Cal: 26 mg/dL (ref 5–40)

## 2018-10-10 LAB — HGB A1C W/O EAG: Hgb A1c MFr Bld: 6.6 % — ABNORMAL HIGH (ref 4.8–5.6)

## 2018-12-25 LAB — HM PAP SMEAR

## 2019-02-28 ENCOUNTER — Other Ambulatory Visit: Payer: Self-pay | Admitting: Medical

## 2019-04-12 ENCOUNTER — Other Ambulatory Visit: Payer: Self-pay

## 2019-04-12 ENCOUNTER — Ambulatory Visit (INDEPENDENT_AMBULATORY_CARE_PROVIDER_SITE_OTHER): Payer: 59 | Admitting: Neurology

## 2019-04-12 ENCOUNTER — Encounter: Payer: Self-pay | Admitting: Neurology

## 2019-04-12 VITALS — BP 146/99 | HR 102 | Temp 97.5°F | Ht 71.0 in | Wt 241.0 lb

## 2019-04-12 DIAGNOSIS — M7918 Myalgia, other site: Secondary | ICD-10-CM | POA: Diagnosis not present

## 2019-04-12 DIAGNOSIS — G43709 Chronic migraine without aura, not intractable, without status migrainosus: Secondary | ICD-10-CM | POA: Diagnosis not present

## 2019-04-12 NOTE — Patient Instructions (Signed)
Rimegepant oral dissolving tablet What is this medicine? RIMEGEPANT (ri ME je pant) is used to treat migraine headaches with or without aura. An aura is a strange feeling or visual disturbance that warns you of an attack. It is not used to prevent migraines. This medicine may be used for other purposes; ask your health care provider or pharmacist if you have questions. COMMON BRAND NAME(S): NURTEC ODT What should I tell my health care provider before I take this medicine? They need to know if you have any of these conditions:  kidney disease  liver disease  an unusual or allergic reaction to rimegepant, other medicines, foods, dyes, or preservatives  pregnant or trying to get pregnant  breast-feeding How should I use this medicine? Take the medicine by mouth. Follow the directions on the prescription label. Leave the tablet in the sealed blister pack until you are ready to take it. With dry hands, open the blister and gently remove the tablet. If the tablet breaks or crumbles, throw it away and take a new tablet out of the blister pack. Place the tablet in the mouth and allow it to dissolve, and then swallow. Do not cut, crush, or chew this medicine. You do not need water to take this medicine. Talk to your pediatrician about the use of this medicine in children. Special care may be needed. Overdosage: If you think you have taken too much of this medicine contact a poison control center or emergency room at once. NOTE: This medicine is only for you. Do not share this medicine with others. What if I miss a dose? This does not apply. This medicine is not for regular use. What may interact with this medicine? This medicine may interact with the following medications:  certain medicines for fungal infections like fluconazole, itraconazole  rifampin This list may not describe all possible interactions. Give your health care provider a list of all the medicines, herbs, non-prescription drugs,  or dietary supplements you use. Also tell them if you smoke, drink alcohol, or use illegal drugs. Some items may interact with your medicine. What should I watch for while using this medicine? Visit your health care professional for regular checks on your progress. Tell your health care professional if your symptoms do not start to get better or if they get worse. What side effects may I notice from receiving this medicine? Side effects that you should report to your doctor or health care professional as soon as possible:  allergic reactions like skin rash, itching or hives; swelling of the face, lips, or tongue Side effects that usually do not require medical attention (report these to your doctor or health care professional if they continue or are bothersome):  nausea This list may not describe all possible side effects. Call your doctor for medical advice about side effects. You may report side effects to FDA at 1-800-FDA-1088. Where should I keep my medicine? Keep out of the reach of children. Store at room temperature between 15 and 30 degrees C (59 and 86 degrees F). Throw away any unused medicine after the expiration date. NOTE: This sheet is a summary. It may not cover all possible information. If you have questions about this medicine, talk to your doctor, pharmacist, or health care provider.  2020 Elsevier/Gold Standard (2018-05-04 00:21:31)  

## 2019-04-12 NOTE — Progress Notes (Signed)
[id number]    Provider:  Dr Jaynee Eagles Requesting Provider: Jinny Sanders, MD Primary Care Provider:  Dian Queen, MD  CC:  Migraines  HPI:  Candice [name] is a 44 y.o. female here as requested by Jinny Sanders, MD for migraines. PMHx neck pain, migraines, anxiety. Migraines ongoing for years. She has twitching of her SCM muscles and by the time she gets home at night she is very tight in the cervical muscles and this triggers her headaches. Massage helps, chiropractor helps. Migraines are Pounding, pulsating, throbbing, light and sound sensitivity, a dark room helps, mother has migraines, slight nausea, sleep helps, she has headaches consistently most of the month, 25 headache days a month, with 15 moderate-severe or severe migraines days a month for over a year. They can last 24-72 hours. She did not like the triptan the way it made her feel. She has tried maxalt and did not like it, also tried baclofen, mobic, zofran, diclofenac, topamax, amitriptyline, wellbutrin. She has had MRI of the brain in the past with a repeat with Dr. Jannifer Franklin, CT of the head 07/2018 was normal. No aura. No medication overuse. She has examined her eating, food triggers, she sleeps well and exercised. 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 Not exertional or positional, no vision changes, no focal neuro deficits, no red flags. No other focal neurologic deficits, associated symptoms, inciting events or modifiable factors.  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 There is also mild central canal  narrowing from C3 through C6.  Hgba1c 6.6  Review of Systems: Patient complains of symptoms per HPI as well as the following symptoms: migraines, neck pain. Pertinent negatives and positives per HPI. All others negative.   Social History   Socioeconomic History  . Marital status: Married    Spouse name: Not on file  . Number of children: 2  . Years of education: Not on file  . Highest education level: Not on file  Occupational History  . Occupation: Therapist, sports  Tobacco Use  . Smoking status: Never Smoker  . Smokeless tobacco: Never Used  Substance and Sexual Activity  . Alcohol use: No  . Drug use: No  . Sexual activity: Not on file  Other Topics Concern  . Not on file  Social History Narrative   Regular exercise-yes walking 3 times a week   Right handed   Diet vegetarian, fish   Caffeine: 8 oz AM   Social Determinants of Health   Financial Resource Strain:   . Difficulty of Paying Living Expenses: Not on file  Food Insecurity:   . Worried About Charity fundraiser in the Last Year: Not on file  . Ran Out of Food in the Last Year: Not on file  Transportation Needs:   . Lack of Transportation (Medical): Not on file  . Lack of Transportation (Non-Medical): Not on file  Physical Activity:   . Days of Exercise per Week: Not on file  . Minutes of Exercise per Session: Not on file  Stress:   . Feeling of Stress : Not on file  Social Connections:   . Frequency of Communication  with Friends and Family: Not on file  . Frequency of Social Gatherings with Friends and Family: Not on file  . Attends Religious Services: Not on file  . Active Member of Clubs or Organizations: Not on file  . Attends Archivist Meetings: Not on file  . Marital Status: Not on file  Intimate Partner Violence:   . Fear of Current or Ex-Partner: Not on file  . Emotionally Abused: Not on file  . Physically Abused: Not on file  . Sexually Abused: Not on file    Family History  Problem  Relation Age of Onset  . Arthritis Mother   . Diabetes Mother   . Headache Mother   . Hypertension Father   . Heart disease Maternal Grandfather        CAD, MI  . Cancer Paternal Grandmother        melanoma  . Cancer Paternal Grandfather        lung  . Migraines Neg Hx     Past Medical History:  Diagnosis Date  . Anxiety   . Migraines   . Neck pain     Patient Active Problem List   Diagnosis Date Noted  . Chronic migraine without aura without status migrainosus, not intractable 04/13/2019  . PERSONAL HISTORY OF MALIGNANT MELANOMA OF SKIN 09/02/2008    Past Surgical History:  Procedure Laterality Date  . CESAREAN SECTION    . DILATION AND CURETTAGE OF UTERUS    . MELANOMA EXCISION    . TONSILLECTOMY AND ADENOIDECTOMY  07/13/2017    Current Outpatient Medications  Medication Sig Dispense Refill  . albuterol (PROVENTIL HFA;VENTOLIN HFA) 108 (90 Base) MCG/ACT inhaler Inhale 1-2 puffs into the lungs every 6 (six) hours as needed for wheezing or shortness of breath. 1 Inhaler 0  . ALPRAZolam (XANAX) 0.5 MG tablet Take 0.5 mg by mouth as needed.    . baclofen (LIORESAL) 10 MG tablet Take 5 mg by mouth at bedtime as needed.    . Ergocalciferol (VITAMIN D2 PO) Take 2,000 Units by mouth.    . ergocalciferol (VITAMIN D2) 50000 UNITS capsule Take 50,000 Units by mouth once a week.      . Multiple Vitamin (MULTIVITAMIN) tablet Take 1 tablet by mouth daily.       No current facility-administered medications for this visit.    Allergies as of 04/12/2019  . (No Known Allergies)    Vitals: BP (!) 146/99 (BP Location: Right Arm, Patient Position: Sitting)   Pulse (!) 102   Temp (!) 97.5 F (36.4 C) Comment: taken at front  Ht 5\' 11"  (1.803 m)   Wt 241 lb (109.3 kg)   BMI 33.61 kg/m  Last Weight:  Wt Readings from Last 1 Encounters:  04/12/19 241 lb (109.3 kg)   Last Height:   Ht Readings from Last 1 Encounters:  04/12/19 5\' 11"  (1.803 m)     Physical  exam: Exam: Gen: NAD, conversant, well nourised, obese, well groomed                     CV: RRR, no MRG. No Carotid Bruits. No peripheral edema, warm, nontender Eyes: Conjunctivae clear without exudates or hemorrhage  Neuro: Detailed Neurologic Exam  Speech:    Speech is normal; fluent and spontaneous with normal comprehension.  Cognition:    The patient is oriented to person, place, and time;     recent and remote memory intact;     language fluent;  normal attention, concentration,     fund of knowledge Cranial Nerves:    The pupils are equal, round, and reactive to light. The fundi are normal and spontaneous venous pulsations are present. Visual fields are full to finger confrontation. Extraocular movements are intact. Trigeminal sensation is intact and the muscles of mastication are normal. The face is symmetric. The palate elevates in the midline. Hearing intact. Voice is normal. Shoulder shrug is normal. The tongue has normal motion without fasciculations.   Coordination:    Normal finger to nose and heel to shin. Normal rapid alternating movements.   Gait:    Heel-toe and tandem gait are normal.   Motor Observation:    No asymmetry, no atrophy, and no involuntary movements noted. Tone:    Normal muscle tone.    Posture:    Posture is normal. normal erect    Strength:    Strength is V/V in the upper and lower limbs.      Sensation: intact to LT     Reflex Exam:  DTR's:    Deep tendon reflexes in the upper and lower extremities are normal bilaterally.   Toes:    The toes are downgoing bilaterally.   Clonus:    Clonus is absent.    Assessment/Plan: This is a really lovely 44 year old patient here for evaluation of chronic migraines, no medication overuse, no aura, she has failed multiple medications and medication classes, we had a long discussion about the possibilities including other oral medications, the new CGRP injections, and Botox for migraines.  She  has significant cervical myofascial pain, I do not think the neck pain is entirely caused by arthritic changes in her cervical spine, I do think that this is likely part of the migraine disorder and also may be posture or other.  We will send her for dry needling for her cervical myofascial pain, we will start Botox as I do think that this would be the best for patient given her symptoms, we can try other medications acutely in the future such as Nurtec we will start with Maxalt today.  Dry Needling for cervical myofascial pain and migraines Start botox for chronic migraine treatment Maxalt acutely Discussed trying Nurtec or other new acute and preventative medications in the future if needed   Orders Placed This Encounter  Procedures  . Ambulatory referral to Physical Therapy   Discussed: To prevent or relieve headaches, try the following: Cool Compress. Lie down and place a cool compress on your head.  Avoid headache triggers. If certain foods or odors seem to have triggered your migraines in the past, avoid them. A headache diary might help you identify triggers.  Include physical activity in your daily routine. Try a daily walk or other moderate aerobic exercise.  Manage stress. Find healthy ways to cope with the stressors, such as delegating tasks on your to-do list.  Practice relaxation techniques. Try deep breathing, yoga, massage and visualization.  Eat regularly. Eating regularly scheduled meals and maintaining a healthy diet might help prevent headaches. Also, drink plenty of fluids.  Follow a regular sleep schedule. Sleep deprivation might contribute to headaches Consider biofeedback. With this mind-body technique, you learn to control certain bodily functions -- such as muscle tension, heart rate and blood pressure -- to prevent headaches or reduce headache pain.    Proceed to emergency room if you experience new or worsening symptoms or symptoms do not resolve, if you have new  neurologic symptoms or if headache is severe, or  for any concerning symptom.   Provided education and documentation from American headache Society toolbox including articles on: chronic migraine medication overuse headache, chronic migraines, prevention of migraines, behavioral and other nonpharmacologic treatments for headache.  Cc: Jinny Sanders, MD,  Dian Queen, MD  Sarina Ill, MD  [facility] [address] [place], [place] [address]  Phone [phone] Fax [phone]

## 2019-04-13 ENCOUNTER — Telehealth: Payer: Self-pay | Admitting: Neurology

## 2019-04-13 DIAGNOSIS — G43709 Chronic migraine without aura, not intractable, without status migrainosus: Secondary | ICD-10-CM | POA: Insufficient documentation

## 2019-04-13 NOTE — Telephone Encounter (Signed)
Please initiate botox for migraines. This patient is to come to Dr. Jaynee Eagles, thanks

## 2019-04-13 NOTE — Telephone Encounter (Signed)
Dr. Jaynee Eagles completed order sheet. Pt needs to sign consent at first injection appt.

## 2019-04-15 MED ORDER — BOTOX 100 UNITS IJ SOLR: INTRAMUSCULAR | 1 refills | Status: DC

## 2019-04-15 NOTE — Telephone Encounter (Signed)
I called and scheduled the patient, she requested a Monday apt. Earlier options were offered but we worked around her work schedule. I went over the copay program and the SP process.

## 2019-04-15 NOTE — Telephone Encounter (Signed)
I called the patient to schedule their Botox, she did not answer so I left a VM asking her to call back. DWD

## 2019-04-15 NOTE — Addendum Note (Signed)
Addended by: Gildardo Griffes on: 04/15/2019 01:21 PM   Modules accepted: Orders

## 2019-04-15 NOTE — Telephone Encounter (Signed)
Candice [name] could you send her script to [facility]?

## 2019-04-19 NOTE — Telephone Encounter (Signed)
[facility] LVM requesting ICD-10 code for the botox   Cb#855 [id number]

## 2019-05-04 NOTE — Telephone Encounter (Signed)
I called the patient's SP ([facility] [phone]), they stated that they are currently having a delay in receiving Botox so they are not shipping right now. I will call back to check the status later.

## 2019-05-06 NOTE — Telephone Encounter (Signed)
Completed PA request at [phone]. DWD

## 2019-05-10 ENCOUNTER — Ambulatory Visit: Payer: 59

## 2019-05-10 ENCOUNTER — Ambulatory Visit (INDEPENDENT_AMBULATORY_CARE_PROVIDER_SITE_OTHER): Payer: 59 | Admitting: Neurology

## 2019-05-10 ENCOUNTER — Other Ambulatory Visit: Payer: Self-pay

## 2019-05-10 VITALS — Temp 97.1°F

## 2019-05-10 DIAGNOSIS — G43709 Chronic migraine without aura, not intractable, without status migrainosus: Secondary | ICD-10-CM

## 2019-05-10 MED ORDER — NURTEC 75 MG PO TBDP: 75.0000 mg | ORAL_TABLET | Freq: Every day | ORAL | 6 refills | Status: DC | PRN

## 2019-05-10 NOTE — Progress Notes (Signed)
Botox- 100 units x 2 vials Lot: [id number] Expiration: 01/2022 NDC: [id number]  Bacteriostatic 0.9% Sodium Chloride- 18mL total Lot: [id number] Expiration: 06/03/2019 NDC: [id number]  Dx: [id number] S/P

## 2019-05-10 NOTE — Progress Notes (Signed)
25 headache days a month, with 15 moderate-severe or severe migraines days a month for over a year.She hs a lot of clenching causes her pain.    Consent Form Botulism Toxin Injection For Chronic Migraine    Reviewed orally with patient, additionally signature is on file:  Botulism toxin has been approved by the Federal drug administration for treatment of chronic migraine. Botulism toxin does not cure chronic migraine and it may not be effective in some patients.  The administration of botulism toxin is accomplished by injecting a small amount of toxin into the muscles of the neck and head. Dosage must be titrated for each individual. Any benefits resulting from botulism toxin tend to wear off after 3 months with a repeat injection required if benefit is to be maintained. Injections are usually done every 3-4 months with maximum effect peak achieved by about 2 or 3 weeks. Botulism toxin is expensive and you should be sure of what costs you will incur resulting from the injection.  The side effects of botulism toxin use for chronic migraine may include:   -Transient, and usually mild, facial weakness with facial injections  -Transient, and usually mild, head or neck weakness with head/neck injections  -Reduction or loss of forehead facial animation due to forehead muscle weakness  -Eyelid drooping  -Dry eye  -Pain at the site of injection or bruising at the site of injection  -Double vision  -Potential unknown long term risks  Contraindications: You should not have Botox if you are pregnant, nursing, allergic to albumin, have an infection, skin condition, or muscle weakness at the site of the injection, or have myasthenia gravis, Lambert-Eaton syndrome, or ALS.  It is also possible that as with any injection, there may be an allergic reaction or no effect from the medication. Reduced effectiveness after repeated injections is sometimes seen and rarely infection at the injection site may  occur. All care will be taken to prevent these side effects. If therapy is given over a long time, atrophy and wasting in the muscle injected may occur. Occasionally the patient's become refractory to treatment because they develop antibodies to the toxin. In this event, therapy needs to be modified.  I have read the above information and consent to the administration of botulism toxin.    BOTOX PROCEDURE NOTE FOR MIGRAINE HEADACHE    Contraindications and precautions discussed with patient(above). Aseptic procedure was observed and patient tolerated procedure. Procedure performed by Dr. Georgia Dom  The condition has existed for more than 6 months, and pt does not have a diagnosis of ALS, Myasthenia Gravis or Lambert-Eaton Syndrome.  Risks and benefits of injections discussed and pt agrees to proceed with the procedure.  Written consent obtained  These injections are medically necessary. Pt  receives good benefits from these injections. These injections do not cause sedations or hallucinations which the oral therapies may cause.  Description of procedure:  The patient was placed in a sitting position. The standard protocol was used for Botox as follows, with 5 units of Botox injected at each site:   -Procerus muscle, midline injection  -Corrugator muscle, bilateral injection  -Frontalis muscle, bilateral injection, with 2 sites each side, medial injection was performed in the upper one third of the frontalis muscle, in the region vertical from the medial inferior edge of the superior orbital rim. The lateral injection was again in the upper one third of the forehead vertically above the lateral limbus of the cornea, 1.5 cm lateral to the medial  injection site.  -Temporalis muscle injection, 4 sites, bilaterally. The first injection was 3 cm above the tragus of the ear, second injection site was 1.5 cm to 3 cm up from the first injection site in line with the tragus of the ear. The third  injection site was 1.5-3 cm forward between the first 2 injection sites. The fourth injection site was 1.5 cm posterior to the second injection site.   -Occipitalis muscle injection, 3 sites, bilaterally. The first injection was done one half way between the occipital protuberance and the tip of the mastoid process behind the ear. The second injection site was done lateral and superior to the first, 1 fingerbreadth from the first injection. The third injection site was 1 fingerbreadth superiorly and medially from the first injection site.  -Cervical paraspinal muscle injection, 2 sites, bilateral knee first injection site was 1 cm from the midline of the cervical spine, 3 cm inferior to the lower border of the occipital protuberance. The second injection site was 1.5 cm superiorly and laterally to the first injection site.  -Trapezius muscle injection was performed at 3 sites, bilaterally. The first injection site was in the upper trapezius muscle halfway between the inflection point of the neck, and the acromion. The second injection site was one half way between the acromion and the first injection site. The third injection was done between the first injection site and the inflection point of the neck.   Will return for repeat injection in 3 months.   200 units of Botox was used, any Botox not injected was wasted. The patient tolerated the procedure well, there were no complications of the above procedure.

## 2019-05-17 ENCOUNTER — Ambulatory Visit: Payer: 59

## 2019-06-25 ENCOUNTER — Telehealth: Payer: Self-pay | Admitting: Neurology

## 2019-06-25 NOTE — Telephone Encounter (Signed)
Pt called stating she is ready to r/s her BOTOX appt she would prefer to do a Monday after one. Pt also sent a detailed mychart message.

## 2019-06-28 NOTE — Telephone Encounter (Addendum)
Note pt has been rescheduled.

## 2019-06-28 NOTE — Telephone Encounter (Signed)
I returned patient call.  Message left that appointment was rescheduled to Monday June 14 at 3:30PM.   Please arrive at 3:15PM.

## 2019-07-03 HISTORY — PX: APPENDECTOMY: SHX54

## 2019-07-21 NOTE — Telephone Encounter (Signed)
I called [employer] [phone] and spoke to [place].  She states that [id number] and [phone] are valid, billable and do not require PA.  Ref# for call is [id number].  I called [facility] [phone] and spoke to [place].  States consent is on file. Botox to be delivered on 07/27/2019.

## 2019-07-27 NOTE — Telephone Encounter (Signed)
Medication delivered on 07/27/19 for 08/16/19 appointment. (x2 100 unit vials)

## 2019-08-09 ENCOUNTER — Ambulatory Visit: Payer: 59 | Admitting: Neurology

## 2019-08-16 ENCOUNTER — Ambulatory Visit (INDEPENDENT_AMBULATORY_CARE_PROVIDER_SITE_OTHER): Payer: 59 | Admitting: Neurology

## 2019-08-16 ENCOUNTER — Other Ambulatory Visit: Payer: Self-pay

## 2019-08-16 DIAGNOSIS — G43709 Chronic migraine without aura, not intractable, without status migrainosus: Secondary | ICD-10-CM | POA: Diagnosis not present

## 2019-08-16 NOTE — Progress Notes (Signed)
08/16/2019: Great response, definitely much better, decrease a little more than 50% in frequency and severity. Used nurtec only 4 times, it akes her drowsy. 25 headache days a month, with 15 moderate-severe or severe migraines days a month for over a year.She hs a lot of clenching causes her pain, 10 Units bilat masseters.    Consent Form Botulism Toxin Injection For Chronic Migraine    Reviewed orally with patient, additionally signature is on file:  Botulism toxin has been approved by the Federal drug administration for treatment of chronic migraine. Botulism toxin does not cure chronic migraine and it may not be effective in some patients.  The administration of botulism toxin is accomplished by injecting a small amount of toxin into the muscles of the neck and head. Dosage must be titrated for each individual. Any benefits resulting from botulism toxin tend to wear off after 3 months with a repeat injection required if benefit is to be maintained. Injections are usually done every 3-4 months with maximum effect peak achieved by about 2 or 3 weeks. Botulism toxin is expensive and you should be sure of what costs you will incur resulting from the injection.  The side effects of botulism toxin use for chronic migraine may include:   -Transient, and usually mild, facial weakness with facial injections  -Transient, and usually mild, head or neck weakness with head/neck injections  -Reduction or loss of forehead facial animation due to forehead muscle weakness  -Eyelid drooping  -Dry eye  -Pain at the site of injection or bruising at the site of injection  -Double vision  -Potential unknown long term risks  Contraindications: You should not have Botox if you are pregnant, nursing, allergic to albumin, have an infection, skin condition, or muscle weakness at the site of the injection, or have myasthenia gravis, Lambert-Eaton syndrome, or ALS.  It is also possible that as with any injection,  there may be an allergic reaction or no effect from the medication. Reduced effectiveness after repeated injections is sometimes seen and rarely infection at the injection site may occur. All care will be taken to prevent these side effects. If therapy is given over a long time, atrophy and wasting in the muscle injected may occur. Occasionally the patient's become refractory to treatment because they develop antibodies to the toxin. In this event, therapy needs to be modified.  I have read the above information and consent to the administration of botulism toxin.    BOTOX PROCEDURE NOTE FOR MIGRAINE HEADACHE    Contraindications and precautions discussed with patient(above). Aseptic procedure was observed and patient tolerated procedure. Procedure performed by Dr. Georgia Dom  The condition has existed for more than 6 months, and pt does not have a diagnosis of ALS, Myasthenia Gravis or Lambert-Eaton Syndrome.  Risks and benefits of injections discussed and pt agrees to proceed with the procedure.  Written consent obtained  These injections are medically necessary. Pt  receives good benefits from these injections. These injections do not cause sedations or hallucinations which the oral therapies may cause.  Description of procedure:  The patient was placed in a sitting position. The standard protocol was used for Botox as follows, with 5 units of Botox injected at each site:   -Procerus muscle, midline injection  -Corrugator muscle, bilateral injection  -Frontalis muscle, bilateral injection, with 2 sites each side, medial injection was performed in the upper one third of the frontalis muscle, in the region vertical from the medial inferior edge of the superior  orbital rim. The lateral injection was again in the upper one third of the forehead vertically above the lateral limbus of the cornea, 1.5 cm lateral to the medial injection site.  -Temporalis muscle injection, 4 sites, bilaterally.  The first injection was 3 cm above the tragus of the ear, second injection site was 1.5 cm to 3 cm up from the first injection site in line with the tragus of the ear. The third injection site was 1.5-3 cm forward between the first 2 injection sites. The fourth injection site was 1.5 cm posterior to the second injection site.   -Occipitalis muscle injection, 3 sites, bilaterally. The first injection was done one half way between the occipital protuberance and the tip of the mastoid process behind the ear. The second injection site was done lateral and superior to the first, 1 fingerbreadth from the first injection. The third injection site was 1 fingerbreadth superiorly and medially from the first injection site.  -Cervical paraspinal muscle injection, 2 sites, bilateral knee first injection site was 1 cm from the midline of the cervical spine, 3 cm inferior to the lower border of the occipital protuberance. The second injection site was 1.5 cm superiorly and laterally to the first injection site.  -Trapezius muscle injection was performed at 3 sites, bilaterally. The first injection site was in the upper trapezius muscle halfway between the inflection point of the neck, and the acromion. The second injection site was one half way between the acromion and the first injection site. The third injection was done between the first injection site and the inflection point of the neck.   Will return for repeat injection in 3 months.   200 units of Botox was used, any Botox not injected was wasted. The patient tolerated the procedure well, there were no complications of the above procedure.

## 2019-08-16 NOTE — Progress Notes (Signed)
Botox- 200 units x 1 vial Lot: [id number] Expiration: 04/2022 NDC: 9914-4458-48  Bacteriostatic 0.9% Sodium Chloride- 60mL total Lot: [id number] Expiration: 09/02/2019 NDC: 3225-6720-91  Dx: [id number] S/P

## 2019-09-22 ENCOUNTER — Other Ambulatory Visit: Payer: Self-pay | Admitting: Neurology

## 2019-11-04 ENCOUNTER — Telehealth: Payer: Self-pay | Admitting: Neurology

## 2019-11-04 NOTE — Telephone Encounter (Signed)
I received a call from [place] at [facility]. He called to set up delivery for patient's Botox appointment coming up. He scheduled delivery for 9/9.

## 2019-11-22 NOTE — Progress Notes (Signed)
11/23/2019: Great response. Put extra around the temple areas for pain. She has been having smelling problems since January when her family had covid. Pending results may have back for an appointment for consideration of MRI brain and further testing and treatment.   Orders Placed This Encounter  Procedures  . SAR CoV2 Serology (COVID 19)AB(IGG)IA      08/16/2019: Great response, definitely much better, decrease a little more than 50% in frequency and severity. Used nurtec only 4 times, it akes her drowsy. 25 headache days a month, with 15 moderate-severe or severe migraines days a month for over a year.She hs a lot of clenching causes her pain, 10 Units bilat masseters.    Consent Form Botulism Toxin Injection For Chronic Migraine    Reviewed orally with patient, additionally signature is on file:  Botulism toxin has been approved by the Federal drug administration for treatment of chronic migraine. Botulism toxin does not cure chronic migraine and it may not be effective in some patients.  The administration of botulism toxin is accomplished by injecting a small amount of toxin into the muscles of the neck and head. Dosage must be titrated for each individual. Any benefits resulting from botulism toxin tend to wear off after 3 months with a repeat injection required if benefit is to be maintained. Injections are usually done every 3-4 months with maximum effect peak achieved by about 2 or 3 weeks. Botulism toxin is expensive and you should be sure of what costs you will incur resulting from the injection.  The side effects of botulism toxin use for chronic migraine may include:   -Transient, and usually mild, facial weakness with facial injections  -Transient, and usually mild, head or neck weakness with head/neck injections  -Reduction or loss of forehead facial animation due to forehead muscle weakness  -Eyelid drooping  -Dry eye  -Pain at the site of injection or bruising at the site  of injection  -Double vision  -Potential unknown long term risks  Contraindications: You should not have Botox if you are pregnant, nursing, allergic to albumin, have an infection, skin condition, or muscle weakness at the site of the injection, or have myasthenia gravis, Lambert-Eaton syndrome, or ALS.  It is also possible that as with any injection, there may be an allergic reaction or no effect from the medication. Reduced effectiveness after repeated injections is sometimes seen and rarely infection at the injection site may occur. All care will be taken to prevent these side effects. If therapy is given over a long time, atrophy and wasting in the muscle injected may occur. Occasionally the patient's become refractory to treatment because they develop antibodies to the toxin. In this event, therapy needs to be modified.  I have read the above information and consent to the administration of botulism toxin.    BOTOX PROCEDURE NOTE FOR MIGRAINE HEADACHE    Contraindications and precautions discussed with patient(above). Aseptic procedure was observed and patient tolerated procedure. Procedure performed by Dr. Georgia Dom  The condition has existed for more than 6 months, and pt does not have a diagnosis of ALS, Myasthenia Gravis or Lambert-Eaton Syndrome.  Risks and benefits of injections discussed and pt agrees to proceed with the procedure.  Written consent obtained  These injections are medically necessary. Pt  receives good benefits from these injections. These injections do not cause sedations or hallucinations which the oral therapies may cause.  Description of procedure:  The patient was placed in a sitting position. The standard  protocol was used for Botox as follows, with 5 units of Botox injected at each site:   -Procerus muscle, midline injection  -Corrugator muscle, bilateral injection  -Frontalis muscle, bilateral injection, with 2 sites each side, medial injection was  performed in the upper one third of the frontalis muscle, in the region vertical from the medial inferior edge of the superior orbital rim. The lateral injection was again in the upper one third of the forehead vertically above the lateral limbus of the cornea, 1.5 cm lateral to the medial injection site.  -Temporalis muscle injection, 4 sites, bilaterally. The first injection was 3 cm above the tragus of the ear, second injection site was 1.5 cm to 3 cm up from the first injection site in line with the tragus of the ear. The third injection site was 1.5-3 cm forward between the first 2 injection sites. The fourth injection site was 1.5 cm posterior to the second injection site.   -Occipitalis muscle injection, 3 sites, bilaterally. The first injection was done one half way between the occipital protuberance and the tip of the mastoid process behind the ear. The second injection site was done lateral and superior to the first, 1 fingerbreadth from the first injection. The third injection site was 1 fingerbreadth superiorly and medially from the first injection site.  -Cervical paraspinal muscle injection, 2 sites, bilateral knee first injection site was 1 cm from the midline of the cervical spine, 3 cm inferior to the lower border of the occipital protuberance. The second injection site was 1.5 cm superiorly and laterally to the first injection site.  -Trapezius muscle injection was performed at 3 sites, bilaterally. The first injection site was in the upper trapezius muscle halfway between the inflection point of the neck, and the acromion. The second injection site was one half way between the acromion and the first injection site. The third injection was done between the first injection site and the inflection point of the neck.   Will return for repeat injection in 3 months.   200 units of Botox was used, any Botox not injected was wasted. The patient tolerated the procedure well, there were no  complications of the above procedure.

## 2019-11-23 ENCOUNTER — Other Ambulatory Visit: Payer: Self-pay

## 2019-11-23 ENCOUNTER — Ambulatory Visit (INDEPENDENT_AMBULATORY_CARE_PROVIDER_SITE_OTHER): Payer: 59 | Admitting: Neurology

## 2019-11-23 DIAGNOSIS — G43709 Chronic migraine without aura, not intractable, without status migrainosus: Secondary | ICD-10-CM

## 2019-11-23 NOTE — Progress Notes (Signed)
Botox- 100 units x 2 vials Lot: [id number] Expiration: 01/2022 NDC: 5913-6859-92  Bacteriostatic 0.9% Sodium Chloride- 32mL total Lot: [id number] Expiration: 12/02/2020 NDC: 6016-5800-63  Dx: [id number] S/P

## 2019-11-24 LAB — SAR COV2 SEROLOGY (COVID19)AB(IGG),IA: DiaSorin SARS-CoV-2 Ab, IgG: NEGATIVE

## 2019-12-05 ENCOUNTER — Other Ambulatory Visit: Payer: Self-pay | Admitting: Neurology

## 2019-12-08 ENCOUNTER — Other Ambulatory Visit: Payer: Self-pay | Admitting: Neurology

## 2019-12-08 MED ORDER — METHYLPREDNISOLONE 4 MG PO TBPK: ORAL_TABLET | ORAL | 1 refills | Status: DC

## 2019-12-08 NOTE — Progress Notes (Signed)
mes

## 2020-01-12 ENCOUNTER — Other Ambulatory Visit: Payer: Self-pay | Admitting: Neurology

## 2020-02-07 ENCOUNTER — Encounter: Payer: Self-pay | Admitting: Neurology

## 2020-02-07 ENCOUNTER — Ambulatory Visit (INDEPENDENT_AMBULATORY_CARE_PROVIDER_SITE_OTHER): Payer: 59 | Admitting: Neurology

## 2020-02-07 VITALS — BP 149/93 | HR 97 | Ht 71.0 in | Wt 245.0 lb

## 2020-02-07 DIAGNOSIS — R430 Anosmia: Secondary | ICD-10-CM

## 2020-02-07 DIAGNOSIS — Z79899 Other long term (current) drug therapy: Secondary | ICD-10-CM

## 2020-02-07 DIAGNOSIS — R431 Parosmia: Secondary | ICD-10-CM | POA: Diagnosis not present

## 2020-02-07 DIAGNOSIS — G629 Polyneuropathy, unspecified: Secondary | ICD-10-CM | POA: Diagnosis not present

## 2020-02-07 DIAGNOSIS — R439 Unspecified disturbances of smell and taste: Secondary | ICD-10-CM

## 2020-02-07 DIAGNOSIS — R768 Other specified abnormal immunological findings in serum: Secondary | ICD-10-CM

## 2020-02-07 DIAGNOSIS — S04819D Injury of olfactory [1st ] nerve, unspecified side, subsequent encounter: Secondary | ICD-10-CM

## 2020-02-07 NOTE — Patient Instructions (Signed)
Blood work MRI brain

## 2020-02-07 NOTE — Progress Notes (Signed)
[facility]    Provider:  Dr Jaynee Eagles Requesting Provider: Dian Queen, MD Primary Care Provider:  Dian Queen, MD  CC:  Migraines  Interval history 02/07/2020: She had Covid about 1.5 years ago, she lost her smell in January of this year, she has weird smells, her husband was cooking sausage and it smelled like paint thinners, oils in the diffuser also smell, she is not phantom smells but certain things don;t smell right. Nothing smells normal. Her tsate is normal. She went to ENT 2 months ago, he did look and did not see anything and CT of sinuses. No seizures, no vision changes, no new medications, no new fabric softener, she saw Dr. Redmond Baseman, botox helped the TMJ. Worsening, becoming stronger, all the time, continuous, even perfume doesn;t smell like perfume. No vision changes. No other sensory changes or loss.     CT 07/2018: showed No acute intracranial abnormalities including mass lesion or mass effect, hydrocephalus, extra-axial fluid collection, midline shift, hemorrhage, or acute infarction, large ischemic events (personally reviewed images)      HPI:  Candice [name] is a 44 y.o. female here as requested by Dian Queen, MD for migraines. PMHx neck pain, migraines, anxiety. Migraines ongoing for years. She has twitching of her SCM muscles and by the time she gets home at night she is very tight in the cervical muscles and this triggers her headaches. Massage helps, chiropractor helps. Migraines are Pounding, pulsating, throbbing, light and sound sensitivity, a dark room helps, mother has migraines, slight nausea, sleep helps, she has headaches consistently most of the month, 25 headache days a month, with 15 moderate-severe or severe migraines days a month for over a year. They can last 24-72 hours. She did not like the triptan the way it made her feel. She has tried maxalt and did not like it, also tried baclofen, mobic, zofran, diclofenac, topamax,  amitriptyline, wellbutrin. She has had MRI of the brain in the past with a repeat with Dr. Jannifer Franklin, CT of the head 07/2018 was normal. No aura. No medication overuse. She has examined her eating, food triggers, she sleeps well and exercised. 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 Not exertional or positional, no vision changes, no focal neuro deficits, no red flags. No other focal neurologic deficits, associated symptoms, inciting events or modifiable factors.  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 There is also mild central canal narrowing from C3 through C6.  Hgba1c 6.6  Review of Systems: Patient complains of symptoms per HPI as well as the following symptoms: fatigue, smell disorder, neck pain, migraines. pertinent negatives and positives per HPI. All others negative    Social History   Socioeconomic History  . Marital status: Married    Spouse name: Not on file  . Number of children: 2  . Years of education: Not on file  . Highest education level: Not on file  Occupational History  . Occupation: Therapist, sports  Tobacco Use  . Smoking status: Never Smoker  . Smokeless tobacco: Never Used  Vaping Use  . Vaping Use: Never used  Substance and Sexual Activity  . Alcohol use: No  .  Drug use: No  . Sexual activity: Not on file  Other Topics Concern  . Not on file  Social History Narrative   Regular exercise-yes walking 3 times a week   Right handed   Diet vegetarian, fish   Caffeine: 8 oz AM   Social Determinants of Health   Financial Resource Strain:   . Difficulty of Paying Living  Expenses: Not on file  Food Insecurity:   . Worried About Charity fundraiser in the Last Year: Not on file  . Ran Out of Food in the Last Year: Not on file  Transportation Needs:   . Lack of Transportation (Medical): Not on file  . Lack of Transportation (Non-Medical): Not on file  Physical Activity:   . Days of Exercise per Week: Not on file  . Minutes of Exercise per Session: Not on file  Stress:   . Feeling of Stress : Not on file  Social Connections:   . Frequency of Communication with Friends and Family: Not on file  . Frequency of Social Gatherings with Friends and Family: Not on file  . Attends Religious Services: Not on file  . Active Member of Clubs or Organizations: Not on file  . Attends Archivist Meetings: Not on file  . Marital Status: Not on file  Intimate Partner Violence:   . Fear of Current or Ex-Partner: Not on file  . Emotionally Abused: Not on file  . Physically Abused: Not on file  . Sexually Abused: Not on file    Family History  Problem Relation Age of Onset  . Arthritis Mother   . Diabetes Mother   . Headache Mother   . Hypertension Father   . Heart disease Maternal Grandfather        CAD, MI  . Cancer Paternal Grandmother        melanoma  . Cancer Paternal Grandfather        lung  . Migraines Neg Hx     Past Medical History:  Diagnosis Date  . Anxiety   . Migraines   . Neck pain     Patient Active Problem List   Diagnosis Date Noted  . Dysosmia 02/07/2020  . Chronic migraine without aura without status migrainosus, not intractable 04/13/2019  . PERSONAL HISTORY OF MALIGNANT MELANOMA OF SKIN 09/02/2008    Past Surgical History:  Procedure Laterality Date  . APPENDECTOMY  07/2019   @ [facility]  . CESAREAN SECTION    . DILATION AND CURETTAGE OF UTERUS    . MELANOMA EXCISION    . TONSILLECTOMY AND ADENOIDECTOMY  07/13/2017    Current Outpatient Medications  Medication Sig Dispense Refill  . albuterol (PROVENTIL HFA;VENTOLIN  HFA) 108 (90 Base) MCG/ACT inhaler Inhale 1-2 puffs into the lungs every 6 (six) hours as needed for wheezing or shortness of breath. 1 Inhaler 0  . ALPRAZolam (XANAX) 0.5 MG tablet Take 0.5 mg by mouth as needed.    . baclofen (LIORESAL) 10 MG tablet Take 5 mg by mouth at bedtime as needed.    . botulinum toxin Type A (BOTOX) 100 units SOLR injection INJECT 155 UNITS  INTRAMUSCULARLY INTO HEAD  AND NECK AS DIRECTED (GIVEN AT MD OFFICE, DISCARD  UNUSED) 2 each 1  . methylPREDNISolone (MEDROL DOSEPAK) 4 MG TBPK tablet Take pills in the morning with food for 6 days 21 tablet 1  . Multiple Vitamin (MULTIVITAMIN) tablet Take 1 tablet by mouth daily.      . NURTEC 75 MG  TBDP TAKE 1 TAB BY MOUTH DAILY AS NEEDED. FOR MIGRAINES. TAKE AS CLOSE TO ONSET OF MIGRAINE AS POSSIBLE 10 tablet 6   No current facility-administered medications for this visit.    Allergies as of 02/07/2020  . (No Known Allergies)    Vitals: BP (!) 149/93 (BP Location: Right Arm, Patient Position: Sitting)   Pulse 97   Ht 5\' 11"  (1.803 m)   Wt 245 lb (111.1 kg)   BMI 34.17 kg/m  Last Weight:  Wt Readings from Last 1 Encounters:  02/07/20 245 lb (111.1 kg)   Last Height:   Ht Readings from Last 1 Encounters:  02/07/20 5\' 11"  (1.803 m)   Physical exam: Exam: Gen: NAD, conversant, well nourised, obese, well groomed                     CV: RRR, no MRG. No Carotid Bruits. No peripheral edema, warm, nontender Eyes: Conjunctivae clear without exudates or hemorrhage  Neuro: Detailed Neurologic Exam  Speech:    Speech is normal; fluent and spontaneous with normal comprehension.  Cognition:    The patient is oriented to person, place, and time;     recent and remote memory intact;     language fluent;     normal attention, concentration,     fund of knowledge Cranial Nerves:    The pupils are equal, round, and reactive to light. The fundi are normal and spontaneous venous pulsations are present. Visual fields are  full to finger confrontation. Extraocular movements are intact. Trigeminal sensation is intact and the muscles of mastication are normal. The face is symmetric. The palate elevates in the midline. Hearing intact. Voice is normal. Shoulder shrug is normal. The tongue has normal motion without fasciculations.   Coordination:    No dysmetria or ataxia  Gait: Normal native gait  Motor Observation:    No asymmetry, no atrophy, and no involuntary movements noted. Tone:    Normal muscle tone.    Posture:    Posture is normal. normal erect    Strength:    Strength is V/V in the upper and lower limbs.      Sensation: intact to LT     Reflex Exam:  DTR's:    Deep tendon reflexes in the upper and lower extremities are normal bilaterally.   Toes:    The toes are downgoing bilaterally.   Clonus:    Clonus is absent.    Assessment/Plan: 44 year old with smell disturbance. Needs a throough examination. I don;t think it is seizures, the dysosmia is continuous. May be viral related. Need to rule out vitamin deficiencies, tumors, infiltrative lesions, toxins.   Orders Placed This Encounter  Procedures  . MR BRAIN W WO CONTRAST  . Vitamin A  . B12 and Folate Panel  . Methylmalonic acid, serum  . Zinc  . Vitamin B1  . Vitamin B6  . Sjogren's syndrome antibods(ssa + ssb)  . ANA w/Reflex  . Heavy metals, blood  . Copper, serum  . Basic Metabolic Panel   Discussed: To prevent or relieve headaches, try the following: Cool Compress. Lie down and place a cool compress on your head.  Avoid headache triggers. If certain foods or odors seem to have triggered your migraines in the past, avoid them. A headache diary might help you identify triggers.  Include physical activity in your daily routine. Try a daily walk or other moderate aerobic exercise.  Manage stress. Find healthy ways to cope with  the stressors, such as delegating tasks on your to-do list.  Practice relaxation techniques. Try  deep breathing, yoga, massage and visualization.  Eat regularly. Eating regularly scheduled meals and maintaining a healthy diet might help prevent headaches. Also, drink plenty of fluids.  Follow a regular sleep schedule. Sleep deprivation might contribute to headaches Consider biofeedback. With this mind-body technique, you learn to control certain bodily functions -- such as muscle tension, heart rate and blood pressure -- to prevent headaches or reduce headache pain.    Proceed to emergency room if you experience new or worsening symptoms or symptoms do not resolve, if you have new neurologic symptoms or if headache is severe, or for any concerning symptom.   Provided education and documentation from American headache Society toolbox including articles on: chronic migraine medication overuse headache, chronic migraines, prevention of migraines, behavioral and other nonpharmacologic treatments for headache.  Cc: Dian Queen, MD,  Dian Queen, MD  Sarina Ill, [facility] [address] [place], [place] [address]  Phone [phone] Fax [phone]

## 2020-02-08 ENCOUNTER — Telehealth: Payer: Self-pay | Admitting: Neurology

## 2020-02-08 NOTE — Telephone Encounter (Signed)
(  2) 100U vials of Botox delivered today from [facility]. 

## 2020-02-08 NOTE — Telephone Encounter (Signed)
[employer] pending

## 2020-02-09 ENCOUNTER — Other Ambulatory Visit: Payer: Self-pay | Admitting: Neurology

## 2020-02-09 NOTE — Addendum Note (Signed)
Addended by: Inis Sizer D on: 02/09/2020 09:30 AM   Modules accepted: Orders

## 2020-02-09 NOTE — Addendum Note (Signed)
Addended by: Sarina Ill B on: 02/09/2020 09:28 AM   Modules accepted: Orders

## 2020-02-10 LAB — SPECIMEN STATUS REPORT

## 2020-02-10 LAB — ANTINUCLEAR ANTIBODIES, IFA: ANA Titer 1: NEGATIVE

## 2020-02-11 LAB — BASIC METABOLIC PANEL
BUN/Creatinine Ratio: 18 (ref 9–23)
BUN: 13 mg/dL (ref 6–24)
CO2: 20 mmol/L (ref 20–29)
Calcium: 9.3 mg/dL (ref 8.7–10.2)
Chloride: 104 mmol/L (ref 96–106)
Creatinine, Ser: 0.71 mg/dL (ref 0.57–1.00)
GFR calc Af Amer: 121 mL/min/{1.73_m2} (ref >59)
GFR calc non Af Amer: 105 mL/min/{1.73_m2} (ref >59)
Glucose: 184 mg/dL — ABNORMAL HIGH (ref 65–99)
Potassium: 3.4 mmol/L — ABNORMAL LOW (ref 3.5–5.2)
Sodium: 139 mmol/L (ref 134–144)

## 2020-02-11 LAB — HEAVY METALS, BLOOD
Arsenic: <1 ug/L — ABNORMAL LOW (ref 2–23)
Lead, Blood: <1 ug/dL (ref 0–4)
Mercury: <1 ug/L (ref 0.0–14.9)

## 2020-02-11 LAB — METHYLMALONIC ACID, SERUM: Methylmalonic Acid: 147 nmol/L (ref 0–378)

## 2020-02-11 LAB — SJOGREN'S SYNDROME ANTIBODS(SSA + SSB)
ENA SSA (RO) Ab: <0.2 AI (ref 0.0–0.9)
ENA SSB (LA) Ab: <0.2 AI (ref 0.0–0.9)

## 2020-02-11 LAB — VITAMIN B1: Thiamine: 129.5 nmol/L (ref 66.5–200.0)

## 2020-02-11 LAB — B12 AND FOLATE PANEL
Folate: 6.4 ng/mL (ref >3.0)
Vitamin B-12: 388 pg/mL (ref 232–1245)

## 2020-02-11 LAB — VITAMIN B6: Vitamin B6: 6.5 ug/L (ref 2.0–32.8)

## 2020-02-11 LAB — ZINC: Zinc: 85 ug/dL (ref 44–115)

## 2020-02-11 LAB — COPPER, SERUM: Copper: 122 ug/dL (ref 80–158)

## 2020-02-11 LAB — ENA+DNA/DS+SJORGEN'S
ENA RNP Ab: 1.9 AI — ABNORMAL HIGH (ref 0.0–0.9)
ENA SM Ab Ser-aCnc: <0.2 AI (ref 0.0–0.9)
dsDNA Ab: 9 IU/mL (ref 0–9)

## 2020-02-11 LAB — ANA W/REFLEX: Anti Nuclear Antibody (ANA): POSITIVE — AB

## 2020-02-11 LAB — VITAMIN A: Vitamin A: 47.2 ug/dL (ref 20.1–62.0)

## 2020-02-14 NOTE — Telephone Encounter (Signed)
no to the covid questions MR Brain w/wo contrast Dr. Jaynee Eagles [facility] Josem Kaufmann: [id number] (exp. 02/10/20 to 03/26/20) . Patient is scheduled at [facility] for 02/16/20.

## 2020-02-16 ENCOUNTER — Other Ambulatory Visit: Payer: Self-pay

## 2020-02-16 ENCOUNTER — Ambulatory Visit (INDEPENDENT_AMBULATORY_CARE_PROVIDER_SITE_OTHER): Payer: 59

## 2020-02-16 DIAGNOSIS — G629 Polyneuropathy, unspecified: Secondary | ICD-10-CM

## 2020-02-16 DIAGNOSIS — R439 Unspecified disturbances of smell and taste: Secondary | ICD-10-CM | POA: Diagnosis not present

## 2020-02-16 DIAGNOSIS — R430 Anosmia: Secondary | ICD-10-CM

## 2020-02-16 DIAGNOSIS — S04819D Injury of olfactory [1st ] nerve, unspecified side, subsequent encounter: Secondary | ICD-10-CM

## 2020-02-16 DIAGNOSIS — R431 Parosmia: Secondary | ICD-10-CM

## 2020-02-16 MED ORDER — GADOBENATE DIMEGLUMINE 529 MG/ML IV SOLN
15.0000 mL | Freq: Once | INTRAVENOUS | Status: AC | PRN
  Administered 2020-02-16: 13:00:00 15 mL via INTRAVENOUS

## 2020-02-18 ENCOUNTER — Telehealth: Payer: Self-pay | Admitting: Neurology

## 2020-02-18 NOTE — Telephone Encounter (Signed)
Pt called, Dr. Jaynee Eagles instructed me to call you to give you the heads up I will be sending FMLA papers.

## 2020-02-22 ENCOUNTER — Ambulatory Visit (INDEPENDENT_AMBULATORY_CARE_PROVIDER_SITE_OTHER): Payer: 59 | Admitting: Neurology

## 2020-02-22 DIAGNOSIS — G43709 Chronic migraine without aura, not intractable, without status migrainosus: Secondary | ICD-10-CM | POA: Diagnosis not present

## 2020-02-22 NOTE — Progress Notes (Signed)
11/23/2019: Great response. Put extra around the temple areas for pain. She has been having smelling problems since January when her family had covid. Pending results may have back for an appointment for consideration of MRI brain and further testing and treatment. 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   No orders of the defined types were placed in this encounter.     08/16/2019: Great response, definitely much better, decrease a little more than 50% in frequency and severity. Used nurtec only 4 times, it akes her drowsy. 25 headache days a month, with 15 moderate-severe or severe migraines days a month for over a year.She hs a lot of clenching causes her pain, 10 Units bilat masseters.    Consent Form Botulism Toxin Injection For Chronic Migraine    Reviewed orally with patient, additionally signature is on file:  Botulism toxin has been approved by the Federal drug administration for treatment of chronic migraine. Botulism toxin does not cure chronic migraine and it may not be effective in some patients.  The administration of botulism toxin is accomplished by injecting a small amount of toxin into the muscles of the neck and head. Dosage must be titrated for each individual. Any benefits resulting from botulism toxin tend to wear off after 3 months with a repeat injection required if benefit is to be maintained. Injections are usually done every 3-4 months with maximum effect peak achieved by about 2 or 3 weeks. Botulism toxin is expensive and you should be sure of what costs you will incur resulting from the injection.  The side effects of botulism toxin use for chronic migraine may include:   -Transient, and usually mild, facial weakness with facial injections  -Transient, and usually mild, head or neck weakness with head/neck injections  -Reduction or loss of forehead facial animation due to forehead muscle weakness  -Eyelid drooping  -Dry eye  -Pain  at the site of injection or bruising at the site of injection  -Double vision  -Potential unknown long term risks  Contraindications: You should not have Botox if you are pregnant, nursing, allergic to albumin, have an infection, skin condition, or muscle weakness at the site of the injection, or have myasthenia gravis, Lambert-Eaton syndrome, or ALS.  It is also possible that as with any injection, there may be an allergic reaction or no effect from the medication. Reduced effectiveness after repeated injections is sometimes seen and rarely infection at the injection site may occur. All care will be taken to prevent these side effects. If therapy is given over a long time, atrophy and wasting in the muscle injected may occur. Occasionally the patient's become refractory to treatment because they develop antibodies to the toxin. In this event, therapy needs to be modified.  I have read the above information and consent to the administration of botulism toxin.    BOTOX PROCEDURE NOTE FOR MIGRAINE HEADACHE    Contraindications and precautions discussed with patient(above). Aseptic procedure was observed and patient tolerated procedure. Procedure performed by Dr. Georgia Dom  The condition has existed for more than 6 months, and pt does not have a diagnosis of ALS, Myasthenia Gravis or Lambert-Eaton Syndrome.  Risks and benefits of injections discussed and pt agrees to proceed with the procedure.  Written consent obtained  These injections are medically necessary. Pt  receives good benefits from these injections. These injections do not cause sedations or hallucinations which the oral therapies may cause.  Description of procedure:  The patient was placed in a sitting position. The standard protocol was used for Botox as follows, with 5 units of Botox injected at each site:   -Procerus muscle, midline injection  -Corrugator muscle, bilateral injection  -Frontalis muscle, bilateral  injection, with 2 sites each side, medial injection was performed in the upper one third of the frontalis muscle, in the region vertical from the medial inferior edge of the superior orbital rim. The lateral injection was again in the upper one third of the forehead vertically above the lateral limbus of the cornea, 1.5 cm lateral to the medial injection site.  -Temporalis muscle injection, 4 sites, bilaterally. The first injection was 3 cm above the tragus of the ear, second injection site was 1.5 cm to 3 cm up from the first injection site in line with the tragus of the ear. The third injection site was 1.5-3 cm forward between the first 2 injection sites. The fourth injection site was 1.5 cm posterior to the second injection site.   -Occipitalis muscle injection, 3 sites, bilaterally. The first injection was done one half way between the occipital protuberance and the tip of the mastoid process behind the ear. The second injection site was done lateral and superior to the first, 1 fingerbreadth from the first injection. The third injection site was 1 fingerbreadth superiorly and medially from the first injection site.  -Cervical paraspinal muscle injection, 2 sites, bilateral knee first injection site was 1 cm from the midline of the cervical spine, 3 cm inferior to the lower border of the occipital protuberance. The second injection site was 1.5 cm superiorly and laterally to the first injection site.  -Trapezius muscle injection was performed at 3 sites, bilaterally. The first injection site was in the upper trapezius muscle halfway between the inflection point of the neck, and the acromion. The second injection site was one half way between the acromion and the first injection site. The third injection was done between the first injection site and the inflection point of the neck.   Will return for repeat injection in 3 months.   200 units of Botox was used, any Botox not injected was wasted. The  patient tolerated the procedure well, there were no complications of the above procedure.

## 2020-02-22 NOTE — Progress Notes (Signed)
Botox- 100 units x 2 vials Lot: [id number] Expiration: 04/2022 NDC: 8916-9450-38  Bacteriostatic 0.9% Sodium Chloride- 17mL total Lot: [id number] Expiration: 04/04/2021 NDC: 3491-7915-05  Dx: [id number] S/P

## 2020-02-23 ENCOUNTER — Telehealth: Payer: Self-pay | Admitting: Neurology

## 2020-02-23 DIAGNOSIS — Z0289 Encounter for other administrative examinations: Secondary | ICD-10-CM

## 2020-02-23 NOTE — Telephone Encounter (Signed)
Left a voice mail stating that pt needs to call the office to pay the fee so her [employer] form can be completed.

## 2020-02-24 NOTE — Telephone Encounter (Signed)
FMLA completed, signed and sent to medical records for processing.

## 2020-03-15 ENCOUNTER — Other Ambulatory Visit: Payer: Self-pay | Admitting: Neurology

## 2020-03-15 MED ORDER — RIZATRIPTAN BENZOATE 10 MG PO TBDP: 10.0000 mg | ORAL_TABLET | ORAL | 11 refills | Status: DC | PRN

## 2020-03-22 ENCOUNTER — Other Ambulatory Visit: Payer: Self-pay | Admitting: Neurology

## 2020-03-22 MED ORDER — PREDNISONE 20 MG PO TABS: 60.0000 mg | ORAL_TABLET | Freq: Every day | ORAL | 2 refills | Status: DC

## 2020-03-31 ENCOUNTER — Encounter: Payer: Self-pay | Admitting: Internal Medicine

## 2020-04-03 ENCOUNTER — Ambulatory Visit: Payer: 59 | Admitting: Internal Medicine

## 2020-04-06 ENCOUNTER — Other Ambulatory Visit: Payer: Self-pay | Admitting: Neurology

## 2020-04-17 ENCOUNTER — Ambulatory Visit: Payer: 59 | Admitting: Internal Medicine

## 2020-05-08 ENCOUNTER — Telehealth: Payer: Self-pay | Admitting: Neurology

## 2020-05-08 NOTE — Telephone Encounter (Signed)
Patient has a Botox appointment 3/22. I called [facility] and spoke with Alisa to check status. She states Botox TBD 3/9.

## 2020-05-09 ENCOUNTER — Other Ambulatory Visit: Payer: Self-pay | Admitting: Neurology

## 2020-05-09 MED ORDER — CYCLOBENZAPRINE HCL 10 MG PO TABS: 10.0000 mg | ORAL_TABLET | Freq: Every day | ORAL | 3 refills | Status: DC

## 2020-05-10 NOTE — Telephone Encounter (Signed)
Received (2) 100 unit vials of Botox today from [facility]. 

## 2020-05-13 ENCOUNTER — Other Ambulatory Visit: Payer: Self-pay | Admitting: Neurology

## 2020-05-22 ENCOUNTER — Ambulatory Visit: Payer: 59 | Admitting: Legal Medicine

## 2020-05-23 ENCOUNTER — Ambulatory Visit (INDEPENDENT_AMBULATORY_CARE_PROVIDER_SITE_OTHER): Payer: 59 | Admitting: Neurology

## 2020-05-23 ENCOUNTER — Other Ambulatory Visit: Payer: Self-pay

## 2020-05-23 ENCOUNTER — Encounter: Payer: Self-pay | Admitting: Nurse Practitioner

## 2020-05-23 DIAGNOSIS — G43709 Chronic migraine without aura, not intractable, without status migrainosus: Secondary | ICD-10-CM | POA: Diagnosis not present

## 2020-05-23 NOTE — Progress Notes (Signed)
Botox- 100 units x 2 vials Lot: [id number] Expiration: 07/2022 NDC: 8377-9396-88  Bacteriostatic 0.9% Sodium Chloride- 66mL total Lot: [id number] Expiration: 04/04/2021 NDC: 0721-8288-33  Dx: V44.514 S/P

## 2020-05-23 NOTE — Progress Notes (Signed)
05/23/2020: +10 masseters each (ask her if she noticed any difference between 1 shot or 2 shots(2 shots was in her right). Great response. 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  11/23/2019: Great response. Put extra around the temple areas for pain. She has been having smelling problems since January when her family had covid. Pending results may have back for an appointment for consideration of MRI brain and further testing and treatment. 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   No orders of the defined types were placed in this encounter.     08/16/2019: Great response, definitely much better, decrease a little more than 50% in frequency and severity. Used nurtec only 4 times, it akes her drowsy. 25 headache days a month, with 15 moderate-severe or severe migraines days a month for over a year.She hs a lot of clenching causes her pain, 10 Units bilat masseters.    Consent Form Botulism Toxin Injection For Chronic Migraine    Reviewed orally with patient, additionally signature is on file:  Botulism toxin has been approved by the Federal drug administration for treatment of chronic migraine. Botulism toxin does not cure chronic migraine and it may not be effective in some patients.  The administration of botulism toxin is accomplished by injecting a small amount of toxin into the muscles of the neck and head. Dosage must be titrated for each individual. Any benefits resulting from botulism toxin tend to wear off after 3 months with a repeat injection required if benefit is to be maintained. Injections are usually done every 3-4 months with maximum effect peak achieved by about 2 or 3 weeks. Botulism toxin is expensive and you should be sure of what costs you will incur resulting from the injection.  The side effects of botulism toxin use for chronic migraine may  include:   -Transient, and usually mild, facial weakness with facial injections  -Transient, and usually mild, head or neck weakness with head/neck injections  -Reduction or loss of forehead facial animation due to forehead muscle weakness  -Eyelid drooping  -Dry eye  -Pain at the site of injection or bruising at the site of injection  -Double vision  -Potential unknown long term risks  Contraindications: You should not have Botox if you are pregnant, nursing, allergic to albumin, have an infection, skin condition, or muscle weakness at the site of the injection, or have myasthenia gravis, Lambert-Eaton syndrome, or ALS.  It is also possible that as with any injection, there may be an allergic reaction or no effect from the medication. Reduced effectiveness after repeated injections is sometimes seen and rarely infection at the injection site may occur. All care will be taken to prevent these side effects. If therapy is given over a long time, atrophy and wasting in the muscle injected may occur. Occasionally the patient's become refractory to treatment because they develop antibodies to the toxin. In this event, therapy needs to be modified.  I have read the above information and consent to the administration of botulism toxin.    BOTOX PROCEDURE NOTE FOR MIGRAINE HEADACHE    Contraindications and precautions discussed with patient(above). Aseptic procedure was observed and patient tolerated procedure. Procedure performed by Dr. Georgia Dom  The condition has existed for more than 6 months, and pt does not have a diagnosis of ALS, Myasthenia Gravis or Lambert-Eaton Syndrome.  Risks and  benefits of injections discussed and pt agrees to proceed with the procedure.  Written consent obtained  These injections are medically necessary. Pt  receives good benefits from these injections. These injections do not cause sedations or hallucinations which the oral therapies may cause.  Description of  procedure:  The patient was placed in a sitting position. The standard protocol was used for Botox as follows, with 5 units of Botox injected at each site:   -Procerus muscle, midline injection  -Corrugator muscle, bilateral injection  -Frontalis muscle, bilateral injection, with 2 sites each side, medial injection was performed in the upper one third of the frontalis muscle, in the region vertical from the medial inferior edge of the superior orbital rim. The lateral injection was again in the upper one third of the forehead vertically above the lateral limbus of the cornea, 1.5 cm lateral to the medial injection site.  -Temporalis muscle injection, 4 sites, bilaterally. The first injection was 3 cm above the tragus of the ear, second injection site was 1.5 cm to 3 cm up from the first injection site in line with the tragus of the ear. The third injection site was 1.5-3 cm forward between the first 2 injection sites. The fourth injection site was 1.5 cm posterior to the second injection site.   -Occipitalis muscle injection, 3 sites, bilaterally. The first injection was done one half way between the occipital protuberance and the tip of the mastoid process behind the ear. The second injection site was done lateral and superior to the first, 1 fingerbreadth from the first injection. The third injection site was 1 fingerbreadth superiorly and medially from the first injection site.  -Cervical paraspinal muscle injection, 2 sites, bilateral knee first injection site was 1 cm from the midline of the cervical spine, 3 cm inferior to the lower border of the occipital protuberance. The second injection site was 1.5 cm superiorly and laterally to the first injection site.  -Trapezius muscle injection was performed at 3 sites, bilaterally. The first injection site was in the upper trapezius muscle halfway between the inflection point of the neck, and the acromion. The second injection site was one half way  between the acromion and the first injection site. The third injection was done between the first injection site and the inflection point of the neck.   Will return for repeat injection in 3 months.   200 units of Botox was used, any Botox not injected was wasted. The patient tolerated the procedure well, there were no complications of the above procedure.

## 2020-05-29 ENCOUNTER — Other Ambulatory Visit (HOSPITAL_COMMUNITY): Payer: Self-pay | Admitting: *Deleted

## 2020-06-02 ENCOUNTER — Ambulatory Visit (HOSPITAL_COMMUNITY)
Admission: RE | Admit: 2020-06-02 | Discharge: 2020-06-02 | Disposition: A | Discharge status: Home / Self Care | Payer: 59 | Source: Ambulatory Visit | Attending: Cardiology | Admitting: Cardiology

## 2020-06-02 ENCOUNTER — Other Ambulatory Visit: Payer: Self-pay

## 2020-06-08 ENCOUNTER — Encounter: Payer: Self-pay | Admitting: *Deleted

## 2020-06-10 ENCOUNTER — Other Ambulatory Visit: Payer: Self-pay | Admitting: Neurology

## 2020-06-12 ENCOUNTER — Ambulatory Visit: Payer: 59 | Admitting: Nurse Practitioner

## 2020-06-19 ENCOUNTER — Other Ambulatory Visit: Payer: Self-pay

## 2020-06-19 ENCOUNTER — Encounter: Payer: Self-pay | Admitting: Nurse Practitioner

## 2020-06-19 ENCOUNTER — Ambulatory Visit (INDEPENDENT_AMBULATORY_CARE_PROVIDER_SITE_OTHER): Payer: 59 | Admitting: Nurse Practitioner

## 2020-06-19 VITALS — BP 128/80 | HR 99 | Temp 98.2°F | Ht 71.0 in | Wt 257.1 lb

## 2020-06-19 DIAGNOSIS — G43709 Chronic migraine without aura, not intractable, without status migrainosus: Secondary | ICD-10-CM

## 2020-06-19 DIAGNOSIS — R739 Hyperglycemia, unspecified: Secondary | ICD-10-CM | POA: Diagnosis not present

## 2020-06-19 DIAGNOSIS — R5383 Other fatigue: Secondary | ICD-10-CM | POA: Diagnosis not present

## 2020-06-19 DIAGNOSIS — Z7689 Persons encountering health services in other specified circumstances: Secondary | ICD-10-CM

## 2020-06-19 DIAGNOSIS — F411 Generalized anxiety disorder: Secondary | ICD-10-CM | POA: Insufficient documentation

## 2020-06-19 NOTE — Patient Instructions (Signed)
Textbook of family medicine (9th ed., pp. 1062-1073). [place], [place]: [employer].">  Stress, Adult Stress is a normal reaction to life events. Stress is what you feel when life demands more than you are used to, or more than you think you can handle. Some stress can be useful, such as studying for a test or meeting a deadline at work. Stress that occurs too often or for too long can cause problems. It can affect your emotional health and interfere with relationships and normal daily activities. Too much stress can weaken your body's defense system (immune system) and increase your risk for physical illness. If you already have a medical problem, stress can make it worse. What are the causes? All sorts of life events can cause stress. An event that causes stress for one person may not be stressful for another person. Major life events, whether positive or negative, commonly cause stress. Examples include:  Losing a job or starting a new job.  Losing a loved one.  Moving to a new town or home.  Getting married or divorced.  Having a baby.  Getting injured or sick. Less obvious life events can also cause stress, especially if they occur day after day or in combination with each other. Examples include:  Working long hours.  Driving in traffic.  Caring for children.  Being in debt.  Being in a difficult relationship. What are the signs or symptoms? Stress can cause emotional symptoms, including:  Anxiety. This is feeling worried, afraid, on edge, overwhelmed, or out of control.  Anger, including irritation or impatience.  Depression. This is feeling sad, down, helpless, or guilty.  Trouble focusing, remembering, or making decisions. Stress can cause physical symptoms, including:  Aches and pains. These may affect your head, neck, back, stomach, or other areas of your body.  Tight muscles or a clenched jaw.  Low energy.  Trouble sleeping. Stress can cause unhealthy  behaviors, including:  Eating to feel better (overeating) or skipping meals.  Working too much or putting off tasks.  Smoking, drinking alcohol, or using drugs to feel better. How is this diagnosed? Stress is diagnosed through an assessment by your health care provider. He or she may diagnose this condition based on:  Your symptoms and any stressful life events.  Your medical history.  Tests to rule out other causes of your symptoms. Depending on your condition, your health care provider may refer you to a specialist for further evaluation. How is this treated? Stress management techniques are the recommended treatment for stress. Medicine is not typically recommended for the treatment of stress. Techniques to reduce your reaction to stressful life events include:  Stress identification. Monitor yourself for symptoms of stress and identify what causes stress for you. These skills may help you to avoid or prepare for stressful events.  Time management. Set your priorities, keep a calendar of events, and learn to say no. Taking these actions can help you avoid making too many commitments. Techniques for coping with stress include:  Rethinking the problem. Try to think realistically about stressful events rather than ignoring them or overreacting. Try to find the positives in a stressful situation rather than focusing on the negatives.  Exercise. Physical exercise can release both physical and emotional tension. The key is to find a form of exercise that you enjoy and do it regularly.  Relaxation techniques. These relax the body and mind. The key is to find one or more that you enjoy and use the techniques regularly. Examples include: ?   Meditation, deep breathing, or progressive relaxation techniques. ? Yoga or tai chi. ? Biofeedback, mindfulness techniques, or journaling. ? Listening to music, being out in nature, or participating in other hobbies.  Practicing a healthy lifestyle.  Eat a balanced diet, drink plenty of water, limit or avoid caffeine, and get plenty of sleep.  Having a strong support network. Spend time with family, friends, or other people you enjoy being around. Express your feelings and talk things over with someone you trust. Counseling or talk therapy with a mental health professional may be helpful if you are having trouble managing stress on your own.   Follow these instructions at home: Lifestyle  Avoid drugs.  Do not use any products that contain nicotine or tobacco, such as cigarettes, e-cigarettes, and chewing tobacco. If you need help quitting, ask your health care provider.  Limit alcohol intake to no more than 1 drink a day for nonpregnant women and 2 drinks a day for men. One drink equals 12 oz of beer, 5 oz of wine, or 1 oz of hard liquor  Do not use alcohol or drugs to relax.  Eat a balanced diet that includes fresh fruits and vegetables, whole grains, lean meats, fish, eggs, and beans, and low-fat dairy. Avoid processed foods and foods high in added fat, sugar, and salt.  Exercise at least 30 minutes on 5 or more days each week.  Get 7-8 hours of sleep each night.   General instructions  Practice stress management techniques as discussed with your health care provider.  Drink enough fluid to keep your urine clear or pale yellow.  Take over-the-counter and prescription medicines only as told by your health care provider.  Keep all follow-up visits as told by your health care provider. This is important.   Contact a health care provider if:  Your symptoms get worse.  You have new symptoms.  You feel overwhelmed by your problems and can no longer manage them on your own. Get help right away if:  You have thoughts of hurting yourself or others. If you ever feel like you may hurt yourself or others, or have thoughts about taking your own life, get help right away. You can go to your nearest emergency department or  call:  Your local emergency services (911 in the [place]  A suicide crisis helpline, such as the National Suicide Prevention Lifeline at 1-800-273-8255. This is open 24 hours a day. Summary  Stress is a normal reaction to life events. It can cause problems if it happens too often or for too long.  Practicing stress management techniques is the best way to treat stress.  Counseling or talk therapy with a mental health professional may be helpful if you are having trouble managing stress on your own. This information is not intended to replace advice given to you by your health care provider. Make sure you discuss any questions you have with your health care provider. Document Revised: 11/05/2019 Document Reviewed: 11/05/2019 Elsevier Patient Education  2021 Elsevier Inc.  

## 2020-06-19 NOTE — Progress Notes (Signed)
New Patient Office Visit  Subjective:  Patient ID: Candice [name], female    DOB: February 08, 1976  Age: 45 y.o. MRN: [id number]  CC:  Chief Complaint  Patient presents with  . New Patient (Initial Visit)    HPI Candice [name] presents to establish primary care provider. She has not had PCP since her oldest child was born. Has been seeing GYN provider for most health care needs. She states that her pap smear and mammogram are both handled by GYN provider and both are up to date. Her pap is due next year. GYN provider treats for generalized anxiety. Symptoms are well managed with lexapro 10mg  daily and alprazolam 0.5mg  on as needed basis.  Patient sees neurology for chronic migraine headaches. Does take maxalt or nurtec to relieve acute migraines. She tries not to take these medications as both do cause her to feel tired and have negative side effects. Generally, migraines are well controlled.  The patient mentions that she has increased fatigue and joint pain. She feels that these symptoms are likely related to work related stress. 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 She feels like every day is a whirlwind. Feels burned out and fatigued. States that she doesn't feel like talking much when she gets home.  She did have comprehensive lab work done in 02/2020. She was checked for autoimmune disorder, vitamin and mineral deficiencies, and heavy metal toxicity. Most of the labs were within normal limits. Her ANA was positive and ENA RNP antibodies were slightly elevated. Also of note, her glucose was 186 with potassium of 3.4. she is going to have further blood work done later this month. Will follow up as indicated for persistent elevation of glucose and decreased potassium. Was notified per neurology that there was nothing alarming in blood work results.  The patient denies chest pain, chest pressure, or shortness of breath. She denies nausea, vomiting, or  changes in bowel or bladder habits.   Past Medical History:  Diagnosis Date  . Anxiety   . Migraines   . Neck pain     Past Surgical History:  Procedure Laterality Date  . APPENDECTOMY  07/2019   @ [facility]  . CESAREAN SECTION    . DILATION AND CURETTAGE OF UTERUS    . MELANOMA EXCISION    . TONSILLECTOMY AND ADENOIDECTOMY  07/13/2017    Family History  Problem Relation Age of Onset  . Arthritis Mother   . Diabetes Mother   . Headache Mother   . Hypertension Father   . Heart disease Maternal Grandfather        CAD, MI  . Cancer Paternal Grandmother        melanoma  . Cancer Paternal Grandfather        lung  . Migraines Neg Hx     Social History   Socioeconomic History  . Marital status: Married    Spouse name: Not on file  . Number of children: 2  . Years of education: Not on file  . Highest education level: Not on file  Occupational History  . Occupation: Therapist, sports  Tobacco Use  . Smoking status: Never Smoker  . Smokeless tobacco: Never Used  Vaping Use  . Vaping Use: Never used  Substance and Sexual Activity  . Alcohol use: No  . Drug use: No  . Sexual activity: Yes    Partners: Male  Other Topics Concern  . Not on file  Social History Narrative  Regular exercise-yes walking 3 times a week   Right handed   Diet vegetarian, fish   Caffeine: 8 oz AM   Social Determinants of Health   Financial Resource Strain: Not on file  Food Insecurity: Not on file  Transportation Needs: Not on file  Physical Activity: Not on file  Stress: Not on file  Social Connections: Not on file  Intimate Partner Violence: Not on file    ROS Review of Systems  Constitutional: Positive for fatigue. Negative for activity change, chills and fever.  HENT: Negative for congestion, postnasal drip, rhinorrhea, sinus pressure and sinus pain.   Eyes: Negative.   Respiratory: Negative for cough, chest tightness and wheezing.   Cardiovascular: Negative for chest pain and palpitations.   Gastrointestinal: Negative for constipation, diarrhea, nausea and vomiting.  Endocrine: Negative.  Negative for cold intolerance, heat intolerance, polydipsia and polyuria.       Elevated glucose of 186 noted on labs done 02/2020  Musculoskeletal: Positive for arthralgias. Negative for back pain and myalgias.       Generalized joint pain.   Skin: Negative for rash.  Allergic/Immunologic: Negative.   Neurological: Positive for headaches. Negative for dizziness and weakness.  Psychiatric/Behavioral: Positive for dysphoric mood. The patient is nervous/anxious.        Well managed with current medication.  All other systems reviewed and are negative.   Objective:   Today's Vitals   06/19/20 1332  BP: 128/80  Pulse: 99  Temp: 98.2 F (36.8 C)  SpO2: 99%  Weight: 257 lb 1.6 oz (116.6 kg)  Height: 5\' 11"  (1.803 m)   Body mass index is 35.86 kg/m.  Physical Exam Vitals and nursing note reviewed.  Constitutional:      Appearance: Normal appearance. She is well-developed.  HENT:     Head: Normocephalic and atraumatic.     Nose: Nose normal.  Eyes:     Extraocular Movements: Extraocular movements intact.     Conjunctiva/sclera: Conjunctivae normal.     Pupils: Pupils are equal, round, and reactive to light.  Cardiovascular:     Rate and Rhythm: Normal rate and regular rhythm.     Pulses: Normal pulses.     Heart sounds: Normal heart sounds.  Pulmonary:     Effort: Pulmonary effort is normal.     Breath sounds: Normal breath sounds.  Abdominal:     Palpations: Abdomen is soft.  Musculoskeletal:        General: Normal range of motion.     Cervical back: Normal range of motion and neck supple.  Skin:    General: Skin is warm and dry.     Capillary Refill: Capillary refill takes less than 2 seconds.  Neurological:     General: No focal deficit present.     Mental Status: She is alert and oriented to person, place, and time.  Psychiatric:        Mood and Affect: Mood  normal.        Behavior: Behavior normal.        Thought Content: Thought content normal.        Judgment: Judgment normal.     Assessment & Plan:  1. Encounter to establish care Appointment today to establish new primary care provider.   2. Chronic migraine without aura without status migrainosus, not intractable Generally well controlled. Use abortive therapy as needed and as prescribed. Continue visits with neurology as needed and as scheduled.   3. Other fatigue Likely stress related. Getting ready  to change jobs. Will hopefully improve fatigue. Will monitor   4. Elevated blood sugar Labs done 02/2020 done showed blood sugar of 186, though these were not fasting. Due to have a new lab check. Will follow up on new results when available.   5. Generalized anxiety disorder Doing well on current dose lexapro and as needed alprazolam. Continue as prescribed.   Problem List Items Addressed This Visit      Cardiovascular and Mediastinum   Chronic migraine without aura without status migrainosus, not intractable   Relevant Medications   escitalopram (LEXAPRO) 10 MG tablet     Other   Other fatigue   Encounter to establish care - Primary   Elevated blood sugar   Generalized anxiety disorder   Relevant Medications   escitalopram (LEXAPRO) 10 MG tablet      Outpatient Encounter Medications as of 06/19/2020  Medication Sig  . albuterol (PROVENTIL HFA;VENTOLIN HFA) 108 (90 Base) MCG/ACT inhaler Inhale 1-2 puffs into the lungs every 6 (six) hours as needed for wheezing or shortness of breath.  . ALPRAZolam (XANAX) 0.5 MG tablet Take 0.5 mg by mouth as needed.  Marland Kitchen BOTOX 100 units SOLR injection INJECT 155 UNITS  INTRAMUSCULARLY INTO HEAD  AND NECK AS DIRECTED (GIVEN AT MD OFFICE, DISCARD  UNUSED)  . cyclobenzaprine (FLEXERIL) 10 MG tablet Take 1 tablet (10 mg total) by mouth at bedtime.  Marland Kitchen escitalopram (LEXAPRO) 10 MG tablet Take 10 mg by mouth daily.  . Multiple Vitamin  (MULTIVITAMIN) tablet Take 1 tablet by mouth daily.  . NURTEC 75 MG TBDP TAKE 1 TAB BY MOUTH DAILY AS NEEDED. FOR MIGRAINES. TAKE AS CLOSE TO ONSET OF MIGRAINE AS POSSIBLE  . predniSONE (DELTASONE) 20 MG tablet Take 3 tablets (60 mg total) by mouth daily with breakfast.  . [DISCONTINUED] rizatriptan (MAXALT-MLT) 10 MG disintegrating tablet Take 1 tablet (10 mg total) by mouth as needed for migraine. May repeat in 2 hours if needed   No facility-administered encounter medications on file as of 06/19/2020.    Follow-up: Return in about 1 year (around 06/19/2021) for routine physical - follow up sooner if needed .   Ronnell Freshwater, NP

## 2020-06-22 LAB — HM MAMMOGRAPHY

## 2020-06-30 ENCOUNTER — Encounter: Payer: Self-pay | Admitting: Nurse Practitioner

## 2020-07-04 ENCOUNTER — Telehealth: Payer: 59 | Admitting: Physician Assistant

## 2020-07-04 DIAGNOSIS — H109 Unspecified conjunctivitis: Secondary | ICD-10-CM

## 2020-07-04 MED ORDER — OFLOXACIN 0.3 % OP SOLN: 1.0000 [drp] | Freq: Four times a day (QID) | OPHTHALMIC | 0 refills | Status: DC

## 2020-07-04 NOTE — Progress Notes (Signed)
E-Visit for [employer]   We are sorry that you are not feeling well.  Here is how we plan to help!  Based on what you have shared with me it looks like you have conjunctivitis.  Conjunctivitis is a common inflammatory or infectious condition of the eye that is often referred to as "pink eye".  In most cases it is contagious (viral or bacterial). However, not all conjunctivitis requires antibiotics (ex. Allergic).  We have made appropriate suggestions for you based upon your presentation.  I have prescribed Oflaxacin 1-2 drops 4 times a day times 5 days    Take your contacts out, only wear glasses until this is fully healed and then use a new set of contacts.  If no improvement in 24 hours you will need an urgent visit to an opthamologist to ensure there is no corneal ulcer from the contact lenses.  Pink eye can be highly contagious.  It is typically spread through direct contact with secretions, or contaminated objects or surfaces that one may have touched.  Strict handwashing is suggested with soap and water is urged.  If not available, use alcohol based had sanitizer.  Avoid unnecessary touching of the eye.  If you wear contact lenses, you will need to refrain from wearing them until you see no white discharge from the eye for at least 24 hours after being on medication.  You should see symptom improvement in 1-2 days after starting the medication regimen.  Call us if symptoms are not improved in 1-2 days.  Home Care:  Wash your hands often!  Do not wear your contacts until you complete your treatment plan.  Avoid sharing towels, bed linen, personal items with a person who has pink eye.  See attention for anyone in your home with similar symptoms.  Get Help Right Away If:  Your symptoms do not improve.  You develop blurred or loss of vision.  Your symptoms worsen (increased discharge, pain or redness)  Your e-visit answers were reviewed by a board certified advanced clinical  practitioner to complete your personal care plan.  Depending on the condition, your plan could have included both over the counter or prescription medications.  If there is a problem please reply  once you have received a response from your provider.  Your safety is important to [place].  If you have drug allergies check your prescription carefully.    You can use MyChart to ask questions about today's visit, request a non-urgent call back, or ask for a work or school excuse for 24 hours related to this e-Visit. If it has been greater than 24 hours you will need to follow up with your provider, or enter a new e-Visit to address those concerns.   You will get an e-mail in the next two days asking about your experience.  I hope that your e-visit has been valuable and will speed your recovery. Thank you for using e-visits.     Greater than 5 minutes, yet less than 10 minutes of time have been spent researching, coordinating, and implementing care for this patient today

## 2020-07-14 ENCOUNTER — Other Ambulatory Visit: Payer: Self-pay | Admitting: Neurology

## 2020-07-14 MED ORDER — PREDNISONE 20 MG PO TABS: 60.0000 mg | ORAL_TABLET | Freq: Every day | ORAL | 2 refills | Status: DC

## 2020-08-02 HISTORY — PX: LUMBAR LAMINECTOMY: SHX95

## 2020-08-08 ENCOUNTER — Encounter: Payer: Self-pay | Admitting: *Deleted

## 2020-08-09 ENCOUNTER — Encounter: Payer: Self-pay | Admitting: *Deleted

## 2020-08-10 ENCOUNTER — Telehealth: Payer: Self-pay | Admitting: *Deleted

## 2020-08-10 NOTE — Telephone Encounter (Signed)
Completed Nurtec PA on Cover My Meds. Key: [id number]. Awaiting determination from [facility].

## 2020-08-11 ENCOUNTER — Telehealth: Payer: 59 | Admitting: Orthopedic Surgery

## 2020-08-11 DIAGNOSIS — R0989 Other specified symptoms and signs involving the circulatory and respiratory systems: Secondary | ICD-10-CM

## 2020-08-12 ENCOUNTER — Other Ambulatory Visit: Payer: Self-pay | Admitting: Neurology

## 2020-08-12 MED ORDER — AMOXICILLIN-POT CLAVULANATE 875-125 MG PO TABS: 1.0000 | ORAL_TABLET | Freq: Two times a day (BID) | ORAL | 0 refills | Status: AC

## 2020-08-12 MED ORDER — PREDNISONE 20 MG PO TABS: 40.0000 mg | ORAL_TABLET | Freq: Every day | ORAL | 0 refills | Status: AC

## 2020-08-12 MED ORDER — BENZONATATE 100 MG PO CAPS: 100.0000 mg | ORAL_CAPSULE | Freq: Three times a day (TID) | ORAL | 0 refills | Status: DC | PRN

## 2020-08-12 NOTE — Addendum Note (Signed)
Addended by: Silvestre Gunner on: 08/12/2020 07:52 AM   Modules accepted: Orders

## 2020-08-12 NOTE — Progress Notes (Signed)
We are sorry that you are not feeling well.  Here is how we plan to help!  Based on what you have shared with me it looks like you have sinusitis.  Sinusitis is inflammation and infection in the sinus cavities of the head.  Based on your presentation I believe you most likely have Acute Bacterial Sinusitis.  This is an infection caused by bacteria and is treated with antibiotics. I have prescribed Augmentin 875mg /125mg  one tablet twice daily with food, for 7 days.I have also prescribed prednisone 40mg  daily for 5 days. You may use an oral decongestant such as Mucinex D or if you have glaucoma or high blood pressure use plain Mucinex. Saline nasal spray help and can safely be used as often as needed for congestion.  If you develop worsening sinus pain, fever or notice severe headache and vision changes, or if symptoms are not better after completion of antibiotic, please schedule an appointment with a health care provider.    Sinus infections are not as easily transmitted as other respiratory infection, however we still recommend that you avoid close contact with loved ones, especially the very young and elderly.  Remember to wash your hands thoroughly throughout the day as this is the number one way to prevent the spread of infection!  Home Care: Only take medications as instructed by your medical team. Complete the entire course of an antibiotic. Do not take these medications with alcohol. A steam or ultrasonic humidifier can help congestion.  You can place a towel over your head and breathe in the steam from hot water coming from a faucet. Avoid close contacts especially the very young and the elderly. Cover your mouth when you cough or sneeze. Always remember to wash your hands.  Get Help Right Away If: You develop worsening fever or sinus pain. You develop a severe head ache or visual changes. Your symptoms persist after you have completed your treatment plan.  Make sure you Understand these  instructions. Will watch your condition. Will get help right away if you are not doing well or get worse.  Your e-visit answers were reviewed by a board certified advanced clinical practitioner to complete your personal care plan.  Depending on the condition, your plan could have included both over the counter or prescription medications.  If there is a problem please reply  once you have received a response from your provider.  Your safety is important to [place].  If you have drug allergies check your prescription carefully.    You can use MyChart to ask questions about today's visit, request a non-urgent call back, or ask for a work or school excuse for 24 hours related to this e-Visit. If it has been greater than 24 hours you will need to follow up with your provider, or enter a new e-Visit to address those concerns.  You will get an e-mail in the next two days asking about your experience.  I hope that your e-visit has been valuable and will speed your recovery. Thank you for using e-visits.  Greater than 5 minutes, yet less than 10 minutes of time have been spent researching, coordinating and implementing care for this patient today.

## 2020-08-14 ENCOUNTER — Telehealth: Payer: Self-pay | Admitting: *Deleted

## 2020-08-14 DIAGNOSIS — Z0289 Encounter for other administrative examinations: Secondary | ICD-10-CM

## 2020-08-14 NOTE — Telephone Encounter (Signed)
I faxed pt [employer] form on 08/14/20

## 2020-08-14 NOTE — Telephone Encounter (Signed)
[employer] fmla form completed, signed, and sent to medical records.

## 2020-08-16 NOTE — Telephone Encounter (Signed)
Per cover my meds, [facility] has denied the Nurtec.  The reason is due to requirement that patient tries and fails two triptans.  We only have record the patient has tried one.  The patient's last message stated she still had Nurtec left.  If needed in the future, could consider switching to [facility] as they have a savings program.  If appeal is desired send here: Prescription Claim Appeals [place] [address] Fax: [phone]

## 2020-08-18 ENCOUNTER — Encounter: Payer: Self-pay | Admitting: Nurse Practitioner

## 2020-08-21 ENCOUNTER — Telehealth: Payer: Self-pay | Admitting: Neurology

## 2020-08-21 NOTE — Telephone Encounter (Signed)
Patient's next Botox appt is 6/28. Received (2) 100 unit vials of Botox today from [facility].

## 2020-08-22 ENCOUNTER — Other Ambulatory Visit: Payer: Self-pay | Admitting: Nurse Practitioner

## 2020-08-22 DIAGNOSIS — E1165 Type 2 diabetes mellitus with hyperglycemia: Secondary | ICD-10-CM

## 2020-08-22 DIAGNOSIS — R739 Hyperglycemia, unspecified: Secondary | ICD-10-CM

## 2020-08-22 MED ORDER — OZEMPIC (0.25 OR 0.5 MG/DOSE) 2 MG/1.5ML ~~LOC~~ SOPN: 0.5000 mg | PEN_INJECTOR | SUBCUTANEOUS | 1 refills | Status: DC

## 2020-08-22 MED ORDER — METFORMIN HCL ER 500 MG PO TB24: 500.0000 mg | ORAL_TABLET | Freq: Every day | ORAL | 2 refills | Status: DC

## 2020-08-22 NOTE — Progress Notes (Signed)
D/c prescription for metformin. Will do trial ozempic. Will have patient start with 0.25mg  weekly for first 3 weeks then increase dose to 0.5mg  weekly. Follow up appointment should be made in next 2 to 3 months.

## 2020-08-22 NOTE — Progress Notes (Signed)
Blood work done per [facility] showing HgbA1c at 7.9. start metformin XR 500mg  daily. Will have her schedule appointment to discuss in more detail. MyChart message to be sent.

## 2020-08-29 ENCOUNTER — Ambulatory Visit (INDEPENDENT_AMBULATORY_CARE_PROVIDER_SITE_OTHER): Payer: 59 | Admitting: Neurology

## 2020-08-29 ENCOUNTER — Telehealth: Payer: Self-pay | Admitting: Neurology

## 2020-08-29 DIAGNOSIS — G43709 Chronic migraine without aura, not intractable, without status migrainosus: Secondary | ICD-10-CM | POA: Diagnosis not present

## 2020-08-29 NOTE — Progress Notes (Signed)
Botox- 100 units x 2 vials Lot: [id number] Expiration: 11/2021 NDC: 5015-8682-57  Dx: K93.552 S/P

## 2020-08-29 NOTE — Progress Notes (Signed)
08/29/2020: She did not notice any difference between 1-2 injections in masseters so just do 10 units each in one injection. +a. See below doing great, stble. 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 Extra around the temple areas for pain? Or extra above the eyes? Ask her.   05/23/2020: +10 masseters each (ask her if she noticed any difference between 1 shot or 2 shots(2 shots was in her right). Great response. Put extra around the temple areas for pain.She sees 66 patient a day, she is transferring to [place] ask her how it is.  11/23/2019: Great response. Put extra around the temple areas for pain. She has been having smelling problems since January when her family had covid. Pending results may have back for an appointment for consideration of MRI brain and further testing and treatment. 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   No orders of the defined types were placed in this encounter.     08/16/2019: Great response, definitely much better, decrease a little more than 50% in frequency and severity. Used nurtec only 4 times, it akes her drowsy. 25 headache days a month, with 15 moderate-severe or severe migraines days a month for over a year.She hs a lot of clenching causes her pain, 10 Units bilat masseters.    Consent Form Botulism Toxin Injection For Chronic Migraine    Reviewed orally with patient, additionally signature is on file:  Botulism toxin has been approved by the Federal drug administration for treatment of chronic migraine. Botulism toxin does not cure chronic migraine and it may not be effective in some patients.  The administration of botulism toxin is accomplished by injecting a small amount of toxin into the muscles of the neck and head. Dosage must be titrated for each individual. Any benefits resulting from botulism toxin tend to wear off after 3 months with a repeat injection required if benefit  is to be maintained. Injections are usually done every 3-4 months with maximum effect peak achieved by about 2 or 3 weeks. Botulism toxin is expensive and you should be sure of what costs you will incur resulting from the injection.  The side effects of botulism toxin use for chronic migraine may include:   -Transient, and usually mild, facial weakness with facial injections  -Transient, and usually mild, head or neck weakness with head/neck injections  -Reduction or loss of forehead facial animation due to forehead muscle weakness  -Eyelid drooping  -Dry eye  -Pain at the site of injection or bruising at the site of injection  -Double vision  -Potential unknown long term risks  Contraindications: You should not have Botox if you are pregnant, nursing, allergic to albumin, have an infection, skin condition, or muscle weakness at the site of the injection, or have myasthenia gravis, Lambert-Eaton syndrome, or ALS.  It is also possible that as with any injection, there may be an allergic reaction or no effect from the medication. Reduced effectiveness after repeated injections is sometimes seen and rarely infection at the injection site may occur. All care will be taken to prevent these side effects. If therapy is given over a long time, atrophy and wasting in the muscle injected may occur. Occasionally the patient's become refractory to treatment because they develop antibodies to the toxin. In this event, therapy needs to be modified.  I have read the above information and consent to the administration of botulism toxin.  BOTOX PROCEDURE NOTE FOR MIGRAINE HEADACHE    Contraindications and precautions discussed with patient(above). Aseptic procedure was observed and patient tolerated procedure. Procedure performed by Dr. Georgia Dom  The condition has existed for more than 6 months, and pt does not have a diagnosis of ALS, Myasthenia Gravis or Lambert-Eaton Syndrome.  Risks and benefits of  injections discussed and pt agrees to proceed with the procedure.  Written consent obtained  These injections are medically necessary. Pt  receives good benefits from these injections. These injections do not cause sedations or hallucinations which the oral therapies may cause.  Description of procedure:  The patient was placed in a sitting position. The standard protocol was used for Botox as follows, with 5 units of Botox injected at each site:   -Procerus muscle, midline injection  -Corrugator muscle, bilateral injection  -Frontalis muscle, bilateral injection, with 2 sites each side, medial injection was performed in the upper one third of the frontalis muscle, in the region vertical from the medial inferior edge of the superior orbital rim. The lateral injection was again in the upper one third of the forehead vertically above the lateral limbus of the cornea, 1.5 cm lateral to the medial injection site.  -Temporalis muscle injection, 4 sites, bilaterally. The first injection was 3 cm above the tragus of the ear, second injection site was 1.5 cm to 3 cm up from the first injection site in line with the tragus of the ear. The third injection site was 1.5-3 cm forward between the first 2 injection sites. The fourth injection site was 1.5 cm posterior to the second injection site.   -Occipitalis muscle injection, 3 sites, bilaterally. The first injection was done one half way between the occipital protuberance and the tip of the mastoid process behind the ear. The second injection site was done lateral and superior to the first, 1 fingerbreadth from the first injection. The third injection site was 1 fingerbreadth superiorly and medially from the first injection site.  -Cervical paraspinal muscle injection, 2 sites, bilateral knee first injection site was 1 cm from the midline of the cervical spine, 3 cm inferior to the lower border of the occipital protuberance. The second injection site was  1.5 cm superiorly and laterally to the first injection site.  -Trapezius muscle injection was performed at 3 sites, bilaterally. The first injection site was in the upper trapezius muscle halfway between the inflection point of the neck, and the acromion. The second injection site was one half way between the acromion and the first injection site. The third injection was done between the first injection site and the inflection point of the neck.   Will return for repeat injection in 3 months.   200 units of Botox was used, any Botox not injected was wasted. The patient tolerated the procedure well, there were no complications of the above procedure.

## 2020-08-29 NOTE — Telephone Encounter (Signed)
Pt would like an itemized bill for her last Botox appointment.

## 2020-08-31 ENCOUNTER — Ambulatory Visit (INDEPENDENT_AMBULATORY_CARE_PROVIDER_SITE_OTHER): Payer: 59 | Admitting: Nurse Practitioner

## 2020-08-31 ENCOUNTER — Encounter: Payer: Self-pay | Admitting: Nurse Practitioner

## 2020-08-31 ENCOUNTER — Other Ambulatory Visit: Payer: Self-pay

## 2020-08-31 VITALS — BP 116/83 | HR 119 | Temp 97.6°F | Ht 71.0 in | Wt 259.6 lb

## 2020-08-31 DIAGNOSIS — R000 Tachycardia, unspecified: Secondary | ICD-10-CM

## 2020-08-31 DIAGNOSIS — E119 Type 2 diabetes mellitus without complications: Secondary | ICD-10-CM

## 2020-08-31 DIAGNOSIS — Z01818 Encounter for other preprocedural examination: Secondary | ICD-10-CM | POA: Diagnosis not present

## 2020-08-31 DIAGNOSIS — G43709 Chronic migraine without aura, not intractable, without status migrainosus: Secondary | ICD-10-CM

## 2020-08-31 LAB — POCT GLYCOSYLATED HEMOGLOBIN (HGB A1C): Hemoglobin A1C: 8.4 % — AB (ref 4.0–5.6)

## 2020-08-31 NOTE — Progress Notes (Signed)
Established Patient Office Visit  Subjective:  Patient ID: Candice [name], female    DOB: 09/03/1975  Age: 45 y.o. MRN: [id number]  CC:  Chief Complaint  Patient presents with   Pre-op Exam     HPI Candice [name] presents for pre surgical clearance. She is scheduled to have discectomy of L4/L5 on September 01, 2020. She states that about three months ago, she fell through and air vent in home she and her husband were building. She had some severe right hip pain after the fall. States that after about three weeks, she finally went to the walk-in clinic at [facility]. X-rays of the hip, pelvis, and spine were done. Her hips were both fine, but she did have herniation of L4/L5 disc which was laying on the S1 nerve root on the right side. She states that she did have one Transforaminal epidural steroid injection which was last week. She got no relief from the treatment. It was decided that discectomy was the best choice for her to get the most relief.  She is diabetic. Recent labs did show that her HgbA1c was 7.9. she recently started on Ozempic. She has had several rounds of prednisone and steroid injections which have increased her sugars over the past few weeks. HgbA1c is 8.4, which is not unexpected finding given her recent injury and steroid treatments.  She states that the pain is mostly in her right leg. She is having difficulty walking, working, and performing normal activities. She states that she, otherwise feels well. She denies chest pain, chest pressure, or shortness of breath. She denies headaches or visual disturbances. She denies abdominal pain, nausea, vomiting, or changes in bowel or bladder habits.    Past Medical History:  Diagnosis Date   Anxiety    Migraines    Neck pain     Past Surgical History:  Procedure Laterality Date   APPENDECTOMY  07/2019   @ [place] AND CURETTAGE OF UTERUS     MELANOMA EXCISION     TONSILLECTOMY AND  ADENOIDECTOMY  07/13/2017    Family History  Problem Relation Age of Onset   Arthritis Mother    Diabetes Mother    Headache Mother    Hypertension Father    Heart disease Maternal Grandfather        CAD, MI   Cancer Paternal Grandmother        melanoma   Cancer Paternal Grandfather        lung   Migraines Neg Hx     Social History   Socioeconomic History   Marital status: Married    Spouse name: Not on file   Number of children: 2   Years of education: Not on file   Highest education level: Not on file  Occupational History   Occupation: RN  Tobacco Use   Smoking status: Never   Smokeless tobacco: Never  Vaping Use   Vaping Use: Never used  Substance and Sexual Activity   Alcohol use: No   Drug use: No   Sexual activity: Yes    Partners: Male  Other Topics Concern   Not on file  Social History Narrative   Regular exercise-yes walking 3 times a week   Right handed   Diet vegetarian, fish   Caffeine: 8 oz AM   Social Determinants of Health   Financial Resource Strain: Not on file  Food Insecurity: Not on file  Transportation Needs: Not on  file  Physical Activity: Not on file  Stress: Not on file  Social Connections: Not on file  Intimate Partner Violence: Not on file    Outpatient Medications Prior to Visit  Medication Sig Dispense Refill   albuterol (PROVENTIL HFA;VENTOLIN HFA) 108 (90 Base) MCG/ACT inhaler Inhale 1-2 puffs into the lungs every 6 (six) hours as needed for wheezing or shortness of breath. 1 Inhaler 0   ALPRAZolam (XANAX) 0.5 MG tablet Take 0.5 mg by mouth as needed.     BOTOX 100 units SOLR injection INJECT 155 UNITS  INTRAMUSCULARLY INTO HEAD  AND NECK AS DIRECTED (GIVEN AT MD OFFICE, DISCARD  UNUSED) 2 each 1   cyclobenzaprine (FLEXERIL) 10 MG tablet Take 1 tablet (10 mg total) by mouth at bedtime. 30 tablet 3   Multiple Vitamin (MULTIVITAMIN) tablet Take 1 tablet by mouth daily.     NURTEC 75 MG TBDP TAKE 1 TAB BY MOUTH DAILY AS  NEEDED. FOR MIGRAINES. TAKE AS CLOSE TO ONSET OF MIGRAINE AS POSSIBLE 10 tablet 6   Semaglutide,0.25 or 0.5MG /DOS, (OZEMPIC, 0.25 OR 0.5 MG/DOSE,) 2 MG/1.5ML SOPN Inject 0.5 mg into the skin once a week. 1.5 mL 1   No facility-administered medications prior to visit.    Allergies  Allergen Reactions   Daptacel [Diphth-Acell Pertussis-Tetanus]    Milk-Related Compounds     ROS Review of Systems  Constitutional:  Positive for activity change and fatigue. Negative for appetite change, chills and fever.       Activity levels are decreased due to injury of her lumbar spine.   HENT: Negative.  Negative for congestion and sinus pain.   Eyes: Negative.   Respiratory:  Negative for cough, chest tightness and shortness of breath.   Cardiovascular:  Negative for chest pain and palpitations.       Mild tachycardia  Gastrointestinal:  Negative for constipation, diarrhea, nausea and vomiting.  Endocrine: Negative for cold intolerance, heat intolerance, polydipsia and polyuria.       Blood sugars running high with recent steroid treatments   Musculoskeletal:  Positive for arthralgias, back pain, gait problem and myalgias.  Skin:  Negative for rash.  Allergic/Immunologic: Negative.   Neurological:  Negative for dizziness, weakness and headaches.  Hematological: Negative.   Psychiatric/Behavioral:  The patient is nervous/anxious.      Objective:    Physical Exam Vitals and nursing note reviewed.  Constitutional:      Appearance: Normal appearance. She is well-developed. She is obese.  HENT:     Head: Normocephalic and atraumatic.     Nose: Nose normal.     Mouth/Throat:     Mouth: Mucous membranes are moist.  Eyes:     Extraocular Movements: Extraocular movements intact.     Conjunctiva/sclera: Conjunctivae normal.     Pupils: Pupils are equal, round, and reactive to light.  Cardiovascular:     Rate and Rhythm: Regular rhythm. Tachycardia present.     Pulses: Normal pulses.     Heart  sounds: Normal heart sounds.     Comments: ECG done at today's visit does show sinus tachycardia along with non specific T wave abnormality. It was otherwise ok.  Pulmonary:     Effort: Pulmonary effort is normal.     Breath sounds: Normal breath sounds.  Abdominal:     Palpations: Abdomen is soft.  Musculoskeletal:        General: Normal range of motion.     Cervical back: Normal range of motion and neck supple.  Comments: Severe right hip and leg pain. Patient walking with moderate limp, decreased strength and ROM of the right lower extremity.   Lymphadenopathy:     Cervical: No cervical adenopathy.  Skin:    General: Skin is warm and dry.     Capillary Refill: Capillary refill takes less than 2 seconds.  Neurological:     General: No focal deficit present.     Mental Status: She is alert and oriented to person, place, and time.  Psychiatric:        Mood and Affect: Mood normal.        Behavior: Behavior normal.        Thought Content: Thought content normal.        Judgment: Judgment normal.    Today's Vitals   08/31/20 1405  BP: 116/83  Pulse: (!) 119  Temp: 97.6 F (36.4 C)  SpO2: 97%  Weight: 259 lb 9.6 oz (117.8 kg)  Height: 5\' 11"  (1.803 m)   Body mass index is 36.21 kg/m.   Wt Readings from Last 3 Encounters:  08/31/20 259 lb 9.6 oz (117.8 kg)  06/19/20 257 lb 1.6 oz (116.6 kg)  02/07/20 245 lb (111.1 kg)     Health Maintenance Due  Topic Date Due   PNEUMOCOCCAL POLYSACCHARIDE VACCINE AGE 33-64 HIGH RISK  Never done   COVID-19 Vaccine (1) Never done   Pneumococcal Vaccine 35-93 Years old (1 - PCV) Never done   FOOT EXAM  Never done   OPHTHALMOLOGY EXAM  Never done   URINE MICROALBUMIN  Never done   PAP SMEAR-Modifier  08/29/2020    There are no preventive care reminders to display for this patient.  Lab Results  Component Value Date   TSH 1.69 07/10/2010   Lab Results  Component Value Date   WBC 10.3 (A) 08/21/2015   HGB 14.2 08/21/2015    HCT 41.7 08/21/2015   MCV 83.5 08/21/2015   PLT 181.0 07/10/2010   Lab Results  Component Value Date   NA 139 02/07/2020   K 3.4 (L) 02/07/2020   CO2 20 02/07/2020   GLUCOSE 184 (H) 02/07/2020   BUN 13 02/07/2020   CREATININE 0.71 02/07/2020   BILITOT 0.5 08/21/2015   ALKPHOS 91 08/21/2015   AST 14 08/21/2015   ALT 16 08/21/2015   PROT 7.0 08/21/2015   ALBUMIN 4.3 08/21/2015   CALCIUM 9.3 02/07/2020   GFR 120.04 07/23/2010   Lab Results  Component Value Date   CHOL 237 (H) 10/09/2018   Lab Results  Component Value Date   HDL 52 10/09/2018   Lab Results  Component Value Date   LDLCALC 159 (H) 10/09/2018   Lab Results  Component Value Date   TRIG 128 10/09/2018   Lab Results  Component Value Date   CHOLHDL 4 09/08/2008   Lab Results  Component Value Date   HGBA1C 8.4 (A) 08/31/2020      Assessment & Plan:  1. Pre-op examination Preoperative examination today. Patient scheduled to have discectomy of L4/L5 September 01, 2020. ECG did show sinus tachycardia. HgbA1c elevated. She is cleared for surgery but there is increased risk due to tachycardia and elevated blood sugars. Pre-oerative clearance form was completed and faxed to [facility] today.  - EKG 12-Lead  2. Type 2 diabetes mellitus without complication, without long-term current use of insulin (HCC) HgbA1c 8.4 today. This is expected finding as she has had several rounds prednisone and steroid injections to treat pain and inflammation. Patient should monitor  her blood sugars very closely after surgery and notify the office if she is consistently running 150 or above. She voiced understanding and agreement.  - POCT glycosylated hemoglobin (Hb A1C)  3. Sinus tachycardia ECG showing sinus tachycardia. A copy of ECG results were sent to [facility] for review.   4. Chronic migraine without aura without status migrainosus, not intractable Well managed with current medication.   Problem List Items Addressed This  Visit       Cardiovascular and Mediastinum   Chronic migraine without aura without status migrainosus, not intractable     Endocrine   Type 2 diabetes mellitus without complication, without long-term current use of insulin (HCC)   Relevant Orders   POCT glycosylated hemoglobin (Hb A1C) (Completed)     Other   Pre-op examination - Primary   Relevant Orders   EKG 12-Lead (Completed)   Sinus tachycardia    No orders of the defined types were placed in this encounter.   Follow-up: Return in about 3 months (around 12/01/2020) for check HgbA1c.    Ronnell Freshwater, NP

## 2020-09-01 DIAGNOSIS — R000 Tachycardia, unspecified: Secondary | ICD-10-CM | POA: Insufficient documentation

## 2020-09-01 DIAGNOSIS — Z01818 Encounter for other preprocedural examination: Secondary | ICD-10-CM | POA: Insufficient documentation

## 2020-09-01 DIAGNOSIS — E1165 Type 2 diabetes mellitus with hyperglycemia: Secondary | ICD-10-CM | POA: Insufficient documentation

## 2020-09-21 ENCOUNTER — Other Ambulatory Visit: Payer: Self-pay | Admitting: Neurology

## 2020-09-21 MED ORDER — ONDANSETRON 4 MG PO TBDP: 4.0000 mg | ORAL_TABLET | Freq: Three times a day (TID) | ORAL | 3 refills | Status: DC | PRN

## 2020-10-09 ENCOUNTER — Other Ambulatory Visit: Payer: Self-pay | Admitting: Nurse Practitioner

## 2020-10-09 DIAGNOSIS — E1165 Type 2 diabetes mellitus with hyperglycemia: Secondary | ICD-10-CM

## 2020-10-13 ENCOUNTER — Encounter: Payer: Self-pay | Admitting: Nurse Practitioner

## 2020-10-13 ENCOUNTER — Other Ambulatory Visit: Payer: Self-pay | Admitting: Neurology

## 2020-10-17 ENCOUNTER — Other Ambulatory Visit: Payer: Self-pay | Admitting: Neurology

## 2020-10-17 MED ORDER — ONDANSETRON HCL 4 MG PO TABS: 4.0000 mg | ORAL_TABLET | Freq: Three times a day (TID) | ORAL | 6 refills | Status: DC | PRN

## 2020-10-18 ENCOUNTER — Telehealth: Payer: Self-pay | Admitting: *Deleted

## 2020-10-18 ENCOUNTER — Encounter: Payer: Self-pay | Admitting: *Deleted

## 2020-10-18 NOTE — Telephone Encounter (Signed)
You do not meet the requirements of your plan.Your plan covers Nurtec when you have tried two triptan drugs and they did not work for you, or you cannot use them. Your request has been denied. Will send to MD for alternative.

## 2020-10-18 NOTE — Telephone Encounter (Signed)
Per Dr Jaynee Eagles the patient has also tried/failed sumatriptan. New PA, key: [id number]. Received immediate approval, 10/18/2020 to 10/18/2021. Sent patient my chart to inform.

## 2020-10-18 NOTE — Telephone Encounter (Signed)
Nurtec PA, key: [id number], G [phone]. tried maxalt and did not like it, also tried baclofen, mobic, zofran, diclofenac, topamax, amitriptyline, wellbutrin.

## 2020-10-19 ENCOUNTER — Other Ambulatory Visit: Payer: Self-pay | Admitting: Neurology

## 2020-11-01 ENCOUNTER — Encounter: Payer: Self-pay | Admitting: Gastroenterology

## 2020-11-01 NOTE — Telephone Encounter (Signed)
Yes I will do her botox

## 2020-11-02 ENCOUNTER — Ambulatory Visit (INDEPENDENT_AMBULATORY_CARE_PROVIDER_SITE_OTHER): Payer: 59 | Admitting: Nurse Practitioner

## 2020-11-02 ENCOUNTER — Other Ambulatory Visit: Payer: Self-pay

## 2020-11-02 ENCOUNTER — Encounter: Payer: Self-pay | Admitting: Nurse Practitioner

## 2020-11-02 VITALS — BP 139/92 | HR 100 | Temp 98.0°F | Ht 71.0 in | Wt 261.9 lb

## 2020-11-02 DIAGNOSIS — R7989 Other specified abnormal findings of blood chemistry: Secondary | ICD-10-CM | POA: Diagnosis not present

## 2020-11-02 DIAGNOSIS — E119 Type 2 diabetes mellitus without complications: Secondary | ICD-10-CM | POA: Diagnosis not present

## 2020-11-02 DIAGNOSIS — W57XXXA Bitten or stung by nonvenomous insect and other nonvenomous arthropods, initial encounter: Secondary | ICD-10-CM

## 2020-11-02 DIAGNOSIS — Z6836 Body mass index (BMI) 36.0-36.9, adult: Secondary | ICD-10-CM

## 2020-11-02 DIAGNOSIS — R5383 Other fatigue: Secondary | ICD-10-CM | POA: Diagnosis not present

## 2020-11-02 DIAGNOSIS — R000 Tachycardia, unspecified: Secondary | ICD-10-CM | POA: Diagnosis not present

## 2020-11-02 DIAGNOSIS — E1165 Type 2 diabetes mellitus with hyperglycemia: Secondary | ICD-10-CM

## 2020-11-02 DIAGNOSIS — S20364A Insect bite (nonvenomous) of middle front wall of thorax, initial encounter: Secondary | ICD-10-CM

## 2020-11-02 LAB — POCT GLYCOSYLATED HEMOGLOBIN (HGB A1C): Hemoglobin A1C: 7.3 % — AB (ref 4.0–5.6)

## 2020-11-02 MED ORDER — OZEMPIC (0.25 OR 0.5 MG/DOSE) 2 MG/1.5ML ~~LOC~~ SOPN: 0.5000 mg | PEN_INJECTOR | SUBCUTANEOUS | 2 refills | Status: DC

## 2020-11-02 NOTE — Patient Instructions (Signed)

## 2020-11-02 NOTE — Telephone Encounter (Addendum)
Received (2) 100 unit vials of Botox today from [facility] for patient's appointment 9/29.  Obtained PA for Botox via [place] portal. [place] [id number] (11/02/20- 11/02/21). PA is under [place].

## 2020-11-02 NOTE — Progress Notes (Signed)
Established Patient Office Visit  Subjective:  Patient ID: Candice [name], female    DOB: 1976/02/18  Age: 45 y.o. MRN: [id number]  CC:  Chief Complaint  Patient presents with   Follow-up   The patient is 67 weeks postoperative from lumbar discectomy.  Was initially doing better.  States that for past week or 2 she has been having severe fatigue, weight gain, hot flashes, night sweats, and elevated heart rate.  States has been difficult for her to start back to normal activities due to the current symptoms.  She states that she did see her GYN provider about a week ago.  They did draw some blood work.  Patient was contacted by the provider and was told her liver enzymes were triple the normal levels.  The patient does not have any history of abnormal liver functions.  States that 3 days after she had surgery, she did pull a tick off of her abdomen.  She is unsure if this has anything to do with current symptoms. The patient does have an appointment with GI provider for further evaluation of elevated liver functions on 12/06/2020.  Diabetes She presents for her follow-up diabetic visit. She has type 2 diabetes mellitus. No MedicAlert identification noted. Her disease course has been improving. Hypoglycemia symptoms include headaches. Pertinent negatives for hypoglycemia include no dizziness or nervousness/anxiousness. Associated symptoms include fatigue and weakness. Pertinent negatives for diabetes include no chest pain, no polydipsia and no polyuria. There are no hypoglycemic complications. Symptoms are worsening. Risk factors for coronary artery disease include diabetes mellitus, dyslipidemia, hypertension and stress. Current diabetic treatments: ozempic 0.39m weekly. Her weight is fluctuating minimally. She is following a generally healthy diet. Meal planning includes avoidance of concentrated sweets and carbohydrate counting. She has not had a previous visit with a dietitian. Exercise:  patient is recently posteoerative and exercise is gradually increasing. An ACE inhibitor/angiotensin II receptor blocker is not being taken. She does not see a podiatrist.     Past Medical History:  Diagnosis Date   Anxiety    Migraines    Neck pain     Past Surgical History:  Procedure Laterality Date   APP[place]CT[place]Y  07[place]2019   [place] CURETT[place]     MELANOMA EXCISION     TONSILLECTOMY AND ADENOIDECTOMY  07/13/2017    Family History  Problem Relation Age of Onset   Arthritis Mother    Diabetes Mother    Headache Mother    Hypertension Father    Heart disease Maternal Grandfather        CAD, MI   Cancer Paternal Grandmother        melanoma   Cancer Paternal Grandfather        lung   Migraines Neg Hx     Social History   Socioeconomic History   Marital status: Married    Spouse name: Not on file   Number of children: 2   Years of education: Not on file   Highest education level: Not on file  Occupational History   Occupation: RN  Tobacco Use   Smoking status: Never   Smokeless tobacco: Never  Vaping Use   Vaping Use: Never used  Substance and Sexual Activity   Alcohol use: No   Drug use: No   Sexual activity: Yes    Partners: Male  Other Topics Concern   Not on file  Social History Narrative   Regular  exercise-yes walking 3 times a week   Right handed   Diet vegetarian, fish   Caffeine: 8 oz AM   Social Determinants of Health   Financial Resource Strain: Not on file  Food Insecurity: Not on file  Transportation Needs: Not on file  Physical Activity: Not on file  Stress: Not on file  Social Connections: Not on file  Intimate Partner Violence: Not on file    Outpatient Medications Prior to Visit  Medication Sig Dispense Refill   albuterol (PROVENTIL HFA;VENTOLIN HFA) 108 (90 Base) MCG/ACT inhaler Inhale 1-2 puffs into the lungs every 6 (six) hours as needed for wheezing or shortness of breath. 1  Inhaler 0   ALPRAZolam (XANAX) 0.5 MG tablet Take 0.5 mg by mouth as needed.     BOTOX 100 units SOLR injection INJECT 155 UNITS  INTRAMUSCULARLY INTO HEAD  AND NECK AS DIRECTED (GIVEN AT MD OFFICE, DISCARD  UNUSED) 2 each 1   cyclobenzaprine (FLEXERIL) 10 MG tablet TAKE 1 TABLET BY MOUTH EVERYDAY AT BEDTIME 30 tablet 3   Multiple Vitamin (MULTIVITAMIN) tablet Take 1 tablet by mouth daily.     NURTEC 75 MG TBDP TAKE 1 TAB BY MOUTH DAILY AS NEEDED. FOR MIGRAINES. TAKE AS CLOSE TO ONSET OF MIGRAINE AS POSSIBLE 10 tablet 6   ondansetron (ZOFRAN) 4 MG tablet Take 1-2 tablets (4-8 mg total) by mouth every 8 (eight) hours as needed for nausea or vomiting. 30 tablet 6   OZEMPIC, 0.25 OR 0.5 MG/DOSE, 2 MG/1.5ML SOPN INJECT 0.5 MG INTO THE SKIN ONCE A WEEK. 1.5 mL 1   predniSONE (DELTASONE) 20 MG tablet TAKE 3 TABLETS (60 MG TOTAL) BY MOUTH DAILY WITH BREAKFAST. (Patient not taking: Reported on 11/02/2020) 15 tablet 2   No facility-administered medications prior to visit.    Allergies  Allergen Reactions   Daptacel [Diphth-Acell Pertussis-Tetanus]    Milk-Related Compounds     ROS Review of Systems  Constitutional:  Positive for activity change, fatigue and unexpected weight change. Negative for appetite change, chills and fever.  HENT:  Negative for congestion, postnasal drip, rhinorrhea, sinus pressure, sinus pain, sneezing and sore throat.   Eyes: Negative.   Respiratory:  Positive for shortness of breath. Negative for cough, chest tightness and wheezing.        Patient is reporting some shortness of breath with exertion.  Cardiovascular:  Negative for chest pain and palpitations.       Has noted increased heart rate with minimal exertion.  Gastrointestinal:  Positive for nausea. Negative for abdominal pain, constipation, diarrhea and vomiting.  Endocrine: Negative for cold intolerance, heat intolerance, polydipsia and polyuria.       Improvement in blood sugars.  Hemoglobin A1c is 7.3 today,  down from 8.4 at recent check.  Genitourinary:  Negative for dyspareunia, dysuria, flank pain, frequency and urgency.  Musculoskeletal:  Positive for arthralgias and myalgias. Negative for back pain.  Skin:  Negative for rash.  Allergic/Immunologic: Negative.  Negative for environmental allergies.  Neurological:  Positive for weakness and headaches. Negative for dizziness.  Hematological:  Negative for adenopathy.  Psychiatric/Behavioral: Negative.  The patient is not nervous/anxious.      Objective:    Physical Exam Vitals and nursing note reviewed.  Constitutional:      Appearance: Normal appearance. She is well-developed. She is obese.  HENT:     Head: Normocephalic and atraumatic.     Nose: Nose normal.     Mouth/Throat:     Mouth: Mucous membranes  are moist.     Pharynx: Oropharynx is clear.  Eyes:     Extraocular Movements: Extraocular movements intact.     Conjunctiva/sclera: Conjunctivae normal.     Pupils: Pupils are equal, round, and reactive to light.  Cardiovascular:     Rate and Rhythm: Regular rhythm. Tachycardia present.     Pulses: Normal pulses.     Heart sounds: Normal heart sounds.  Pulmonary:     Effort: Pulmonary effort is normal.     Breath sounds: Normal breath sounds.  Abdominal:     General: Bowel sounds are normal.     Palpations: Abdomen is soft. There is no mass.     Tenderness: There is no abdominal tenderness. There is no guarding.  Musculoskeletal:        General: Normal range of motion.     Cervical back: Normal range of motion and neck supple.  Skin:    General: Skin is warm and dry.     Capillary Refill: Capillary refill takes less than 2 seconds.  Neurological:     General: No focal deficit present.     Mental Status: She is alert and oriented to person, place, and time.  Psychiatric:        Mood and Affect: Mood normal.        Behavior: Behavior normal.        Thought Content: Thought content normal.        Judgment: Judgment  normal.    Today's Vitals   11/02/20 0821  BP: (!) 139/92  Pulse: 100  Temp: 98 F (36.7 C)  SpO2: 96%  Weight: 261 lb 14.4 oz (118.8 kg)   Body mass index is 36.53 kg/m.   Wt Readings from Last 3 Encounters:  11/02/20 261 lb 14.4 oz (118.8 kg)  08/31/20 259 lb 9.6 oz (117.8 kg)  06/19/20 257 lb 1.6 oz (116.6 kg)     Health Maintenance Due  Topic Date Due   PNEUMOCOCCAL POLYSACCHARIDE VACCINE AGE 28-64 HIGH RISK  Never done   COVID-19 Vaccine (1) Never done   Pneumococcal Vaccine 42-35 Years old (1 - PCV) Never done   FOOT EXAM  Never done   OPHTHALMOLOGY EXAM  Never done   URINE MICROALBUMIN  Never done   PAP SMEAR-Modifier  08/29/2020   INFLUENZA VACCINE  Never done    There are no preventive care reminders to display for this patient.  Lab Results  Component Value Date   TSH 1.69 07/10/2010   Lab Results  Component Value Date   WBC 10.3 (A) 08/21/2015   HGB 14.2 08/21/2015   HCT 41.7 08/21/2015   MCV 83.5 08/21/2015   PLT 181.0 07/10/2010   Lab Results  Component Value Date   NA 139 02/07/2020   K 3.4 (L) 02/07/2020   CO2 20 02/07/2020   GLUCOSE 184 (H) 02/07/2020   BUN 13 02/07/2020   CREATININE 0.71 02/07/2020   BILITOT 0.5 08/21/2015   ALKPHOS 91 08/21/2015   AST 14 08/21/2015   ALT 16 08/21/2015   PROT 7.0 08/21/2015   ALBUMIN 4.3 08/21/2015   CALCIUM 9.3 02/07/2020   GFR 120.04 07/23/2010   Lab Results  Component Value Date   CHOL 237 (H) 10/09/2018   Lab Results  Component Value Date   HDL 52 10/09/2018   Lab Results  Component Value Date   LDLCALC 159 (H) 10/09/2018   Lab Results  Component Value Date   TRIG 128 10/09/2018   Lab Results  Component Value Date   CHOLHDL 4 09/08/2008   Lab Results  Component Value Date   HGBA1C 7.3 (A) 11/02/2020      Assessment & Plan:  1. Type 2 diabetes mellitus without complication, without long-term current use of insulin (HCC) Blood sugars improving.  Hemoglobin A1c is 7.3 today  down from 8.4 at last check.  We will continue Ozempic at 0.5 mg weekly and prescription was sent to her pharmacy today. - POCT glycosylated hemoglobin (Hb A1C) - Semaglutide,0.25 or 0.5MG/DOS, (OZEMPIC, 0.25 OR 0.5 MG/DOSE,) 2 MG/1.5ML SOPN; Inject 0.5 mg into the skin once a week.  Dispense: 1.5 mL; Refill: 2   2. Elevated LFTs Unclear etiology.  Will check c-Met, hepatitis panel, amylase, and lipase.  Patient does have upcoming appointment with GI provider on 12/06/2020. - Comp Met (CMET) - Hepatitis, Acute - CBC - Amylase - Lipase - Acute Viral Hepatitis (HAV, HBV, HCV)  3. Tick bite of middle front wall of thorax, initial encounter Patient did have tick bite recently.  Will check Lyme disease profile for further evaluation. - Lyme Disease Abs IgG, IgM, IFA, CSF  4. Tachycardia Will check thyroid panel for further evaluation. - TSH + free T4  5. Fatigue, unspecified type Worsening fatigue.  Unclear etiology.  Will check Monospot labs/Epstein-Barr virus profile. - Monospot - EPSTEIN-BARR VIRUS (EBV) Antibody Profile  6. Body mass index (BMI) of 36.0-36.9 in adult Patient currently on Ozempic 0.5 mg to help control blood sugars and to help with weight management.  She is currently limiting intake of high fat and high cholesterol foods.  Exercise is limited due to being recently postoperative from lumbar discectomy.  Will gradually increase physical activity as tolerated and as recommended per surgeon.  Will follow weight closely.   Problem List Items Addressed This Visit       Endocrine   Type 2 diabetes mellitus without complication, without long-term current use of insulin ([place]) - Primary   Relevant Orders   POCT glycosylated hemoglobin (Hb A1C) (Completed)     Musculoskeletal and Integument   Tick bite of middle front wall of thorax   Relevant Orders   Lyme Disease Abs IgG, IgM, IFA, CSF     Other   Fatigue   Relevant Orders   Monospot   EPSTEIN-BARR VIRUS (EBV)  Antibody Profile   Tachycardia   Relevant Orders   TSH + free T4   Elevated LFTs   Relevant Orders   Comp Met (CMET)   Hepatitis, Acute   CBC   Amylase   Lipase   Acute Viral Hepatitis (HAV, HBV, HCV)   Body mass index (BMI) of 36.0-36.9 in adult   This note was dictated using Systems analyst. Rapid proofreading was performed to expedite the delivery of the information. Despite proofreading, phonetic errors will occur which are common with this voice recognition software. Please take this into consideration. If there are any concerns, please contact our office.     Follow-up: Return in 3 months (on 02/01/2021) for diabetes with HgbA1c check.    Ronnell Freshwater, NP

## 2020-11-03 ENCOUNTER — Encounter: Payer: Self-pay | Admitting: Nurse Practitioner

## 2020-11-03 LAB — COMPREHENSIVE METABOLIC PANEL
ALT: 40 IU/L — ABNORMAL HIGH (ref 0–32)
AST: 21 IU/L (ref 0–40)
Albumin/Globulin Ratio: 1.7 (ref 1.2–2.2)
Albumin: 4.3 g/dL (ref 3.8–4.8)
Alkaline Phosphatase: 94 IU/L (ref 44–121)
BUN/Creatinine Ratio: 16 (ref 9–23)
BUN: 9 mg/dL (ref 6–24)
Bilirubin Total: 0.2 mg/dL (ref 0.0–1.2)
CO2: 18 mmol/L — ABNORMAL LOW (ref 20–29)
Calcium: 9.6 mg/dL (ref 8.7–10.2)
Chloride: 100 mmol/L (ref 96–106)
Creatinine, Ser: 0.57 mg/dL (ref 0.57–1.00)
Globulin, Total: 2.6 g/dL (ref 1.5–4.5)
Glucose: 204 mg/dL — ABNORMAL HIGH (ref 65–99)
Potassium: 4.4 mmol/L (ref 3.5–5.2)
Sodium: 137 mmol/L (ref 134–144)
Total Protein: 6.9 g/dL (ref 6.0–8.5)
eGFR: 116 mL/min/{1.73_m2} (ref >59)

## 2020-11-03 LAB — LYME DISEASE SEROLOGY W/REFLEX: Lyme Total Antibody EIA: NEGATIVE

## 2020-11-03 LAB — CBC
Hematocrit: 39 % (ref 34.0–46.6)
Hemoglobin: 11.8 g/dL (ref 11.1–15.9)
MCH: 23.9 pg — ABNORMAL LOW (ref 26.6–33.0)
MCHC: 30.3 g/dL — ABNORMAL LOW (ref 31.5–35.7)
MCV: 79 fL (ref 79–97)
Platelets: 254 10*3/uL (ref 150–450)
RBC: 4.94 x10E6/uL (ref 3.77–5.28)
RDW: 17.4 % — ABNORMAL HIGH (ref 11.7–15.4)
WBC: 7.3 10*3/uL (ref 3.4–10.8)

## 2020-11-03 LAB — ACUTE VIRAL HEPATITIS (HAV, HBV, HCV)
HCV Ab: <0.1 s/co ratio (ref 0.0–0.9)
Hep A IgM: NEGATIVE
Hep B C IgM: NEGATIVE
Hepatitis B Surface Ag: NEGATIVE

## 2020-11-03 LAB — MONONUCLEOSIS SCREEN: Mono Screen: NEGATIVE

## 2020-11-03 LAB — TSH+FREE T4
Free T4: 1.07 ng/dL (ref 0.82–1.77)
TSH: 2.09 u[IU]/mL (ref 0.450–4.500)

## 2020-11-03 LAB — LIPASE: Lipase: 21 U/L (ref 14–72)

## 2020-11-03 LAB — AMYLASE: Amylase: 30 U/L — ABNORMAL LOW (ref 31–110)

## 2020-11-03 LAB — HCV INTERPRETATION

## 2020-11-03 NOTE — Progress Notes (Signed)
Mychart message sent with lab update. Will notify her when remaining labs are resulted.

## 2020-11-05 ENCOUNTER — Encounter: Payer: Self-pay | Admitting: Nurse Practitioner

## 2020-11-05 NOTE — Progress Notes (Signed)
Negative lyme disease and epstein barre cascades. Mychart Message sent to patient.

## 2020-11-07 ENCOUNTER — Other Ambulatory Visit: Payer: Self-pay | Admitting: Nurse Practitioner

## 2020-11-07 DIAGNOSIS — R000 Tachycardia, unspecified: Secondary | ICD-10-CM

## 2020-11-07 DIAGNOSIS — E1165 Type 2 diabetes mellitus with hyperglycemia: Secondary | ICD-10-CM

## 2020-11-07 DIAGNOSIS — R0602 Shortness of breath: Secondary | ICD-10-CM

## 2020-11-07 MED ORDER — SEMAGLUTIDE (1 MG/DOSE) 4 MG/3ML ~~LOC~~ SOPN: 1.0000 mg | PEN_INJECTOR | SUBCUTANEOUS | 2 refills | Status: DC

## 2020-11-07 NOTE — Progress Notes (Signed)
I have ordered chest x-ray due to shortness of breath and referred to cardiology due to persistent sinus tachycardia and palpitations. I increased ozempic to '1mg'$  weekly for better control of blood sugars and weight.

## 2020-11-14 ENCOUNTER — Other Ambulatory Visit: Payer: Self-pay | Admitting: Nurse Practitioner

## 2020-11-14 DIAGNOSIS — R0602 Shortness of breath: Secondary | ICD-10-CM

## 2020-11-14 DIAGNOSIS — R000 Tachycardia, unspecified: Secondary | ICD-10-CM

## 2020-11-14 NOTE — Progress Notes (Signed)
Placed new referral top cardiology. In notes, stated that [facility] cardiology was requested.

## 2020-11-14 NOTE — Telephone Encounter (Signed)
Is that something we can do? I don't know that answer to that.

## 2020-11-30 ENCOUNTER — Other Ambulatory Visit: Payer: Self-pay

## 2020-11-30 ENCOUNTER — Ambulatory Visit (INDEPENDENT_AMBULATORY_CARE_PROVIDER_SITE_OTHER): Payer: 59 | Admitting: Adult Health

## 2020-11-30 DIAGNOSIS — G43709 Chronic migraine without aura, not intractable, without status migrainosus: Secondary | ICD-10-CM | POA: Diagnosis not present

## 2020-11-30 NOTE — Progress Notes (Signed)
Botox- 100 units x 2 vials Lot: [id number] Expiration: 10/2022 NDC: 3893-7342-87  Bacteriostatic 0.9% Sodium Chloride- 85mL total Lot: [id number] Expiration: 03/04/2022 NDC: 2620-3559-74  Dx: B63.845. S/P

## 2020-11-30 NOTE — Progress Notes (Signed)
       BOTOX PROCEDURE NOTE FOR MIGRAINE HEADACHE    Contraindications and precautions discussed with patient(above). Aseptic procedure was observed and patient tolerated procedure. Procedure performed by Ward Givens, NP  The condition has existed for more than 6 months, and pt does not have a diagnosis of ALS, Myasthenia Gravis or Lambert-Eaton Syndrome.  Risks and benefits of injections discussed and pt agrees to proceed with the procedure.  Written consent obtained  These injections are medically necessary. She receives good benefits from these injections. These injections do not cause sedations or hallucinations which the oral therapies may cause.  Indication/Diagnosis: chronic migraine BOTOX(J0585) injection was performed according to protocol by [employer]. 200 units of BOTOX was dissolved into 4 cc NS.   NDC: [address]-59  Type of toxin: Botox Botox- 100 units x 2 vials Lot: [id number] Expiration: 10/2022 NDC: 5364-6803-21   Bacteriostatic 0.9% Sodium Chloride- 8mL total Lot: [id number] Expiration: 03/04/2022 NDC: 0037-0488-89   Dx: V69.450. S/P   Description of procedure:  The patient was placed in a sitting position. The standard protocol was used for Botox as follows, with 5 units of Botox injected at each site:   -Procerus muscle, midline injection  -Corrugator muscle, bilateral injection  -Frontalis muscle, bilateral injection, with 2 sites each side, medial injection was performed in the upper one third of the frontalis muscle, in the region vertical from the medial inferior edge of the superior orbital rim. The lateral injection was again in the upper one third of the forehead vertically above the lateral limbus of the cornea, 1.5 cm lateral to the medial injection site.  -Temporalis muscle injection, 4 sites, bilaterally. The first injection was 3 cm above the tragus of the ear, second injection site was 1.5 cm to 3 cm up from the first injection site in line  with the tragus of the ear. The third injection site was 1.5-3 cm forward between the first 2 injection sites. The fourth injection site was 1.5 cm posterior to the second injection site.  -Occipitalis muscle injection, 3 sites, bilaterally. The first injection was done one half way between the occipital protuberance and the tip of the mastoid process behind the ear. The second injection site was done lateral and superior to the first, 1 fingerbreadth from the first injection. The third injection site was 1 fingerbreadth superiorly and medially from the first injection site.  -Cervical paraspinal muscle injection, 2 sites, bilateral knee first injection site was 1 cm from the midline of the cervical spine, 3 cm inferior to the lower border of the occipital protuberance. The second injection site was 1.5 cm superiorly and laterally to the first injection site.  -Trapezius muscle injection was performed at 3 sites, bilaterally. The first injection site was in the upper trapezius muscle halfway between the inflection point of the neck, and the acromion. The second injection site was one half way between the acromion and the first injection site. The third injection was done between the first injection site and the inflection point of the neck.   Will return for repeat injection in 3 months.   A 200 units of Botox was used, 155 units were injected, the rest of the Botox was wasted. The patient tolerated the procedure well, there were no complications of the above procedure.  Ward Givens, MSN, NP-C 11/30/2020, 3:47 PM [facility] [address] [place], [place] [address] [phone]

## 2020-12-01 ENCOUNTER — Other Ambulatory Visit: Payer: Self-pay | Admitting: Orthopedic Surgery

## 2020-12-01 ENCOUNTER — Ambulatory Visit: Payer: 59 | Admitting: Nurse Practitioner

## 2020-12-01 DIAGNOSIS — M5416 Radiculopathy, lumbar region: Secondary | ICD-10-CM

## 2020-12-02 DIAGNOSIS — M109 Gout, unspecified: Secondary | ICD-10-CM

## 2020-12-02 HISTORY — DX: Gout, unspecified: M10.9

## 2020-12-05 ENCOUNTER — Telehealth: Payer: 59 | Admitting: Family

## 2020-12-05 ENCOUNTER — Ambulatory Visit: Payer: Self-pay | Admitting: Neurology

## 2020-12-05 DIAGNOSIS — M109 Gout, unspecified: Secondary | ICD-10-CM | POA: Diagnosis not present

## 2020-12-06 ENCOUNTER — Ambulatory Visit (INDEPENDENT_AMBULATORY_CARE_PROVIDER_SITE_OTHER): Payer: 59 | Admitting: Gastroenterology

## 2020-12-06 ENCOUNTER — Encounter: Payer: Self-pay | Admitting: Gastroenterology

## 2020-12-06 VITALS — BP 160/100 | HR 112 | Ht 68.75 in | Wt 257.5 lb

## 2020-12-06 DIAGNOSIS — R140 Abdominal distension (gaseous): Secondary | ICD-10-CM | POA: Diagnosis not present

## 2020-12-06 DIAGNOSIS — R194 Change in bowel habit: Secondary | ICD-10-CM

## 2020-12-06 DIAGNOSIS — R7989 Other specified abnormal findings of blood chemistry: Secondary | ICD-10-CM | POA: Diagnosis not present

## 2020-12-06 MED ORDER — COLCHICINE 0.6 MG PO TABS: ORAL_TABLET | ORAL | 0 refills | Status: DC

## 2020-12-06 MED ORDER — NAPROXEN 500 MG PO TABS: 500.0000 mg | ORAL_TABLET | Freq: Two times a day (BID) | ORAL | 0 refills | Status: DC

## 2020-12-06 MED ORDER — PLENVU 140 G PO SOLR: 140.0000 g | ORAL | 0 refills | Status: DC

## 2020-12-06 NOTE — Progress Notes (Signed)
[facility] Consult Note:  History: Candice [name] 12/06/2020  Referring provider: Ronnell Freshwater, NP [facility]  Reason for consult/chief complaint: Elevated Hepatic Enzymes, Fatigue, Bloated, Abdominal Pain (Intermittent LLQ x 2-3 months), Constipation (Alternating with diarrhea), and Diarrhea   Subjective  HPI: From 11/02/2020 PCP visit: "The patient is 67 weeks postoperative from lumbar discectomy.  Was initially doing better.  States that for past week or 2 she has been having severe fatigue, weight gain, hot flashes, night sweats, and elevated heart rate.  States has been difficult for her to start back to normal activities due to the current symptoms.  She states that she did see her GYN provider about a week ago.  They did draw some blood work.  Patient was contacted by the provider and was told her liver enzymes were triple the normal levels.  The patient does not have any history of abnormal liver functions.  States that 3 days after she had surgery, she did pull a tick off of her abdomen.  She is unsure if this has anything to do with current symptoms. The patient does have an appointment with GI provider for further evaluation of elevated liver functions on 12/06/2020."  Subsequent negative Lyme disease and EBV testing  Tonnia was here to see me for abdominal pain and altered bowel habits as well as elevated LFTs.  Last spring she was having intermittent crampy left lower quadrant pain, generalized abdominal bloating and a change in bowel habits with intermittent constipation or diarrhea.  She saw gynecology thinking this may be an ovarian cyst that she had had in the past, says an ultrasound was normal, but some lab work showed significant transaminitis (?  About 130 transaminases by patient's recollection, do not have those records today). When that occurred, she had had an injury to her lower back and was being  treated with multiple rounds of prednisone Tylenol, and had been on chronic doxycycline for acne.  She ultimately had a lower back surgery and a slow recovery from that. Recent LFTs went back to near normal, her digestive symptoms remain similar.  Of note, she had a colonoscopy in 2009 by Dr. Sharlett Iles for diarrhea and intermittent rectal bleeding.  Normal exam, clinical impression was IBS.   ROS:  Review of Systems  Constitutional:  Positive for fatigue. Negative for appetite change and unexpected weight change.  HENT:  Negative for mouth sores and voice change.   Eyes:  Negative for pain and redness.  Respiratory:  Negative for cough and shortness of breath.   Cardiovascular:  Negative for chest pain and palpitations.  Genitourinary:  Negative for dysuria and hematuria.  Musculoskeletal:  Negative for arthralgias and myalgias.  Skin:  Negative for pallor and rash.  Neurological:  Negative for weakness and headaches.  Hematological:  Negative for adenopathy.    Past Medical History: Past Medical History:  Diagnosis Date   Anxiety    GERD (gastroesophageal reflux disease)    Migraines    Neck pain      Past Surgical History: Past Surgical History:  Procedure Laterality Date   APPENDECTOMY  07/2019   @ [facility]   CESAREAN SECTION     DILATION AND CURETTAGE OF UTERUS     LUMBAR LAMINECTOMY  08/02/2020   L4-L5   MELANOMA EXCISION     TONSILLECTOMY AND ADENOIDECTOMY  07/13/2017     Family History: Family History  Problem Relation Age of Onset  Arthritis Mother    Diabetes Mother    Headache Mother    Colon polyps Mother    Hypertension Father    Diabetes Father    Heart disease Maternal Grandfather    Heart attack Maternal Grandfather    Melanoma Paternal Grandmother    Lung cancer Paternal Grandfather    Breast cancer Maternal Aunt    Migraines Neg Hx     Social History: Social History   Socioeconomic History   Marital status: Married    Spouse name: Not on  file   Number of children: 2   Years of education: Not on file   Highest education level: Not on file  Occupational History   Occupation: NP  Tobacco Use   Smoking status: Never   Smokeless tobacco: Never  Vaping Use   Vaping Use: Never used  Substance and Sexual Activity   Alcohol use: No   Drug use: No   Sexual activity: Yes    Partners: Male  Other Topics Concern   Not on file  Social History Narrative   Regular exercise-yes walking 3 times a week   Right handed   Diet vegetarian, fish   Caffeine: 8 oz AM   Social Determinants of Health   Financial Resource Strain: Not on file  Food Insecurity: Not on file  Transportation Needs: Not on file  Physical Activity: Not on file  Stress: Not on file  Social Connections: Not on file   Nurse practitioner with [facility]  Her husband Harrie Jeans is also my patient   Allergies: Allergies  Allergen Reactions   Daptacel [Diphth-Acell Pertussis-Tetanus]    Milk-Related Compounds     Outpatient Meds: Current Outpatient Medications  Medication Sig Dispense Refill   albuterol (PROVENTIL HFA;VENTOLIN HFA) 108 (90 Base) MCG/ACT inhaler Inhale 1-2 puffs into the lungs every 6 (six) hours as needed for wheezing or shortness of breath. 1 Inhaler 0   ALPRAZolam (XANAX) 1 MG tablet Take 0.5 mg by mouth as needed.     BOTOX 100 units SOLR injection INJECT 155 UNITS  INTRAMUSCULARLY INTO HEAD  AND NECK AS DIRECTED (GIVEN AT MD OFFICE, DISCARD  UNUSED) 2 each 1   colchicine 0.6 MG tablet Take 2 tabs immediately, then 1 tab twice per day for the duration of the flare up to a max of 7 days 21 tablet 0   ibuprofen (ADVIL) 200 MG tablet Take 200-600 mg by mouth as needed.     ketoconazole (NIZORAL) 2 % cream Apply topically daily.     Multiple Vitamin (MULTIVITAMIN) tablet Take 1 tablet by mouth daily.     NURTEC 75 MG TBDP TAKE 1 TAB BY MOUTH DAILY AS NEEDED. FOR MIGRAINES. TAKE AS CLOSE TO ONSET OF MIGRAINE AS POSSIBLE 10 tablet 6    PEG-KCl-NaCl-NaSulf-Na Asc-C (PLENVU) 140 g SOLR Take 140 g by mouth as directed. Manufacturer's coupon Universal coupon code:BIN: P2366821; GROUP: [id number]; PCN: [id number]; ID: [id number]; PAY NO MORE $50 1 each 0   Semaglutide, 1 MG/DOSE, 4 MG/3ML SOPN Inject 1 mg as directed once a week. 3 mL 2   tretinoin (RETIN-A) 0.05 % cream Apply 1 application topically every other day.     triamcinolone cream (KENALOG) 0.1 % Apply 1 application topically in the morning and at bedtime.     cyclobenzaprine (FLEXERIL) 10 MG tablet TAKE 1 TABLET BY MOUTH EVERYDAY AT BEDTIME (Patient not taking: Reported on 12/06/2020) 30 tablet 3   No current facility-administered medications for this visit.  The Ozempic was only started in the last couple of months because her glucose had gone up, which she attributes to rounds of prednisone treatment.  Therefore, her digestive symptoms started prior to the Ozempic.  ___________________________________________________________________ Objective   Exam:  BP (!) 160/100 (BP Location: Left Arm, Patient Position: Sitting, Cuff Size: Large)   Pulse (!) 112   Ht 5' 8.75" (1.746 m) Comment: height measured without shoes  Wt 257 lb 8 oz (116.8 kg)   BMI 38.30 kg/m  Wt Readings from Last 3 Encounters:  12/06/20 257 lb 8 oz (116.8 kg)  11/02/20 261 lb 14.4 oz (118.8 kg)  08/31/20 259 lb 9.6 oz (117.8 kg)    General: Well-appearing Eyes: sclera anicteric, no redness ENT: oral mucosa moist without lesions, no cervical or supraclavicular lymphadenopathy CV: RRR without murmur, S1/S2, no JVD, no peripheral edema Resp: clear to auscultation bilaterally, normal RR and effort noted GI: soft, LLQ tenderness, with active bowel sounds. No guarding or palpable organomegaly noted. Skin; warm and dry, no rash or jaundice noted Neuro: awake, alert and oriented x 3. Normal gross motor function and fluent speech  Labs:  CBC Latest Ref Rng & Units 11/02/2020 08/21/2015 07/10/2010  WBC 3.4  - 10.8 x10E3/uL 7.3 10.3(A) 6.9  Hemoglobin 11.1 - 15.9 g/dL 11.8 14.2 12.9  Hematocrit 34.0 - 46.6 % 39.0 41.7 38.6  Platelets 150 - 450 x10E3/uL 254 - 181.0   CMP Latest Ref Rng & Units 11/02/2020 02/07/2020 08/21/2015  Glucose 65 - 99 mg/dL 204(H) 184(H) 100(H)  BUN 6 - 24 mg/dL 9 13 14   Creatinine 0.57 - 1.00 mg/dL 0.57 0.71 1.07  Sodium 134 - 144 mmol/L 137 139 137  Potassium 3.5 - 5.2 mmol/L 4.4 3.4(L) 3.9  Chloride 96 - 106 mmol/L 100 104 97(L)  CO2 20 - 29 mmol/L 18(L) 20 29  Calcium 8.7 - 10.2 mg/dL 9.6 9.3 9.5  Total Protein 6.0 - 8.5 g/dL 6.9 - 7.0  Total Bilirubin 0.0 - 1.2 mg/dL 0.2 - 0.5  Alkaline Phos 44 - 121 IU/L 94 - 91  AST 0 - 40 IU/L 21 - 14  ALT 0 - 32 IU/L 40(H) - 16   Negative acute viral hepatitis panel  Radiologic Studies:  No recent abdominal imaging  Hgb A1c 7.3 on 11/02/20, 8.4 in June '22, 6.6 in Aug 2020  Assessment: Encounter Diagnoses  Name Primary?   Altered bowel habits Yes   Abdominal bloating    LFTs abnormal     About 6 months of lower abdominal pain, more toward the left side, bloating and irregular bowel habits.  Sounds much like IBS, possible SIBO but without clear risk factors for that.  We discussed the nature of that condition and the pitfalls of the available testing.  She had significant transaminitis in the setting of multiple medications, now back to near normal.  I think this was some medication effect, various viral tests were negative, that is unrelated to her lower digestive symptoms.  She is reassured to know her liver function is good  Plan:  I recommended a colonoscopy for her lower GI symptoms.  She was agreeable after discussion of procedure and risks.  The benefits and risks of the planned procedure were described in detail with the patient or (when appropriate) their health care proxy.  Risks were outlined as including, but not limited to, bleeding, infection, perforation, adverse medication reaction leading to cardiac or  pulmonary decompensation, pancreatitis (if ERCP).  The limitation of incomplete mucosal  visualization was also discussed.  No guarantees or warranties were given.  If unrevealing, consider trial of rifaximin (or metronidazole if cost/availability effective)  Thank you for the courtesy of this consult.  Please call me with any questions or concerns.  Nelida Meuse III  CC: Referring provider noted above

## 2020-12-06 NOTE — Progress Notes (Signed)
E-Visit for Gout Symptoms  We are sorry that you are not feeling well. We are here to help!  Based on what you shared with me it looks like you have a flare of your gout.  Gout is a form of arthritis. It can cause pain and swelling in the joints. At first, it tends to affect only 1 joint - most frequently the big toe. It happens in people who have too much uric acid in the blood. Uric acid is a chemical that is produced when the body breaks down certain foods. Uric acid can form sharp needle-like crystals that build up in the joints and cause pain. Uric acid crystals can also form inside the tubes that carry urine from the kidneys to the bladder. These crystals can turn into "kidney stones" that can cause pain and problems with the flow of urine. People with gout get sudden "flares" or attacks of severe pain, most often the big toe, ankle, or knee. Often the joint also turns red and swells. Usually, only 1 joint is affected, but some people have pain in more than 1 joint. Gout flares tend to happen more often during the night.  The pain from gout can be extreme. The pain and swelling are worst at the beginning of a gout flare. The symptoms then get better within a few days to weeks. It is not clear how the body "turns off" a gout flare.  Do not start any NEW preventative medicine until the gout has cleared completely. However, If you are already on Probenecid or Allopurinol for CHRONIC gout, you may continue taking this during an active flare up   I have prescribed Naprosyn 500 mg twice daily as needed for mild pain for 7 days and I have prescribed Colchicine 0.6 mg tabs - Take 2 tabs immediately, then 1 tab twice per day for the duration of the flare up to a max of 7 days (but discontinue for stomach pains or diarrhea) .   HOME CARE Losing weight can help relieve gout. It's not clear that following a specific diet plan will help with gout symptoms but eating a balanced diet can help improve your  overall health. It can also help you lose weight, if you are overweight. In general, a healthy diet includes plenty of fruits, vegetables, whole grains, and low-fat dairy products (labelled "low fat", skim, 2%). Avoid sugar sweetened drinks (including sodas, tea, juice and juice blends, coffee drinks and sports drinks) Limit alcohol to 1-2 drinks of beer, spirits or wine daily these can make gout flares worse. Some people with gout also have other health problems, such as heart disease, high blood pressure, kidney disease, or obesity. If you have any of these issues, it's important to work with your doctor to manage them. This can help improve your overall health and might also help with your gout.  GET HELP RIGHT AWAY IF: Your symptoms persist after you have completed your treatment plan You develop severe diarrhea You develop abnormal sensations  You develop vomiting,   You develop weakness  You develop abdominal pain  FOLLOW UP WITH YOUR PRIMARY PROVIDER IF: If your symptoms do not improve within 10 days  MAKE SURE YOU  Understand these instructions. Will watch your condition. Will get help right away if you are not doing well or get worse.  Thank you for choosing an e-visit.  Your e-visit answers were reviewed by a board certified advanced clinical practitioner to complete your personal care plan. Depending upon  the condition, your plan could have included both over the counter or prescription medications.  Please review your pharmacy choice. Make sure the pharmacy is open so you can pick up prescription now. If there is a problem, you may contact your provider through [employer] and have the prescription routed to another pharmacy.  Your safety is important to [place]. If you have drug allergies check your prescription carefully.   For the next 24 hours you can use MyChart to ask questions about today's visit, request a non-urgent call back, or ask for a work or school excuse. You  will get an email in the next two days asking about your experience. I hope that your e-visit has been valuable and will speed your recovery.   Approximately 5 minutes was spent documenting and reviewing patient's chart.

## 2020-12-06 NOTE — Patient Instructions (Signed)
If you are age 43 or older, your body mass index should be between 23-30. Your Body mass index is 38.3 kg/m. If this is out of the aforementioned range listed, please consider follow up with your Primary Care Provider.  If you are age 73 or younger, your body mass index should be between 19-25. Your Body mass index is 38.3 kg/m. If this is out of the aformentioned range listed, please consider follow up with your Primary Care Provider.   __________________________________________________________  The [facility] providers would like to encourage you to use [facility] to communicate with providers for non-urgent requests or questions.  Due to long hold times on the telephone, sending your provider a message by [facility] may be a faster and more efficient way to get a response.  Please allow 48 business hours for a response.  Please remember that this is for non-urgent requests.   You have been scheduled for a colonoscopy. Please follow written instructions given to you at your visit today.  Please pick up your prep supplies at the pharmacy within the next 1-3 days. If you use inhalers (even only as needed), please bring them with you on the day of your procedure.  Due to recent changes in healthcare laws, you may see the results of your imaging and laboratory studies on MyChart before your provider has had a chance to review them.  We understand that in some cases there may be results that are confusing or concerning to you. Not all laboratory results come back in the same time frame and the provider may be waiting for multiple results in order to interpret others.  Please give [place] 48 hours in order for your provider to thoroughly review all the results before contacting the office for clarification of your results.   It was a pleasure to see you today!  Thank you for trusting me with your gastrointestinal care!

## 2020-12-08 ENCOUNTER — Ambulatory Visit
Admission: RE | Admit: 2020-12-08 | Discharge: 2020-12-08 | Disposition: A | Discharge status: Home / Self Care | Payer: 59 | Source: Ambulatory Visit | Attending: Orthopedic Surgery | Admitting: Orthopedic Surgery

## 2020-12-08 DIAGNOSIS — M5416 Radiculopathy, lumbar region: Secondary | ICD-10-CM

## 2020-12-08 MED ORDER — GADOBENATE DIMEGLUMINE 529 MG/ML IV SOLN
20.0000 mL | Freq: Once | INTRAVENOUS | Status: AC | PRN
  Administered 2020-12-08: 20 mL via INTRAVENOUS

## 2020-12-16 ENCOUNTER — Other Ambulatory Visit: Payer: 59

## 2020-12-27 ENCOUNTER — Other Ambulatory Visit: Payer: Self-pay | Admitting: Nurse Practitioner

## 2020-12-27 DIAGNOSIS — R739 Hyperglycemia, unspecified: Secondary | ICD-10-CM

## 2020-12-28 ENCOUNTER — Other Ambulatory Visit: Payer: Self-pay | Admitting: Family

## 2020-12-28 DIAGNOSIS — M109 Gout, unspecified: Secondary | ICD-10-CM

## 2021-01-04 ENCOUNTER — Encounter: Payer: Self-pay | Admitting: Gastroenterology

## 2021-01-04 ENCOUNTER — Ambulatory Visit (AMBULATORY_SURGERY_CENTER): Payer: 59 | Admitting: Gastroenterology

## 2021-01-04 VITALS — BP 123/89 | HR 90 | Temp 99.3°F | Resp 17 | Ht 68.0 in | Wt 257.0 lb

## 2021-01-04 DIAGNOSIS — R194 Change in bowel habit: Secondary | ICD-10-CM | POA: Diagnosis present

## 2021-01-04 DIAGNOSIS — K6389 Other specified diseases of intestine: Secondary | ICD-10-CM | POA: Diagnosis not present

## 2021-01-04 DIAGNOSIS — R197 Diarrhea, unspecified: Secondary | ICD-10-CM

## 2021-01-04 DIAGNOSIS — R1032 Left lower quadrant pain: Secondary | ICD-10-CM

## 2021-01-04 HISTORY — PX: COLONOSCOPY: SHX174

## 2021-01-04 MED ORDER — SODIUM CHLORIDE 0.9 % IV SOLN: 500.0000 mL | Freq: Once | INTRAVENOUS | Status: DC

## 2021-01-04 NOTE — Progress Notes (Signed)
VS taken by DT 

## 2021-01-04 NOTE — Progress Notes (Signed)
No changes to clinical history since GI office visit on 12/06/20.  The patient is appropriate for an endoscopic procedure in the ambulatory setting.

## 2021-01-04 NOTE — Op Note (Signed)
[facility] Patient Name: Candice [name] Procedure Date: 01/04/2021 11:27 AM MRN: [id number] Endoscopist: Mallie Mussel L. Loletha [name] , MD Age: 45 Referring MD:  Date of Birth: February 14, 1976 Gender: Female Account #: [id number] Procedure:                Colonoscopy Indications:              Abdominal pain in the left lower quadrant, Change                            in bowel habits Medicines:                Monitored Anesthesia Care Procedure:                Pre-Anesthesia Assessment:                           - Prior to the procedure, a History and Physical                            was performed, and patient medications and                            allergies were reviewed. The patient's tolerance of                            previous anesthesia was also reviewed. The risks                            and benefits of the procedure and the sedation                            options and risks were discussed with the patient.                            All questions were answered, and informed consent                            was obtained. Prior Anticoagulants: The patient has                            taken no previous anticoagulant or antiplatelet                            agents. ASA Grade Assessment: II - A patient with                            mild systemic disease. After reviewing the risks                            and benefits, the patient was deemed in                            satisfactory condition to undergo the procedure.  After obtaining informed consent, the colonoscope                            was passed under direct vision. Throughout the                            procedure, the patient's blood pressure, pulse, and                            oxygen saturations were monitored continuously. The                            Olympus CF-HQ190L ([id number]) Colonoscope was                            introduced through the anus and advanced to  the the                            terminal ileum, with identification of the                            appendiceal orifice and IC valve. The colonoscopy                            was performed without difficulty. The patient                            tolerated the procedure well. The quality of the                            bowel preparation was good after lavage. The                            terminal ileum, ileocecal valve, appendiceal                            orifice, and rectum were photographed. Scope In: 11:44:53 AM Scope Out: 12:01:00 PM Scope Withdrawal Time: 0 hours 12 minutes 46 seconds  Total Procedure Duration: 0 hours 16 minutes 7 seconds  Findings:                 The perianal and digital rectal examinations were                            normal.                           The terminal ileum appeared normal.                           Normal mucosa was found in the entire colon.                            Biopsies for histology were taken with a cold  forceps from the ascending colon, transverse colon                            and descending colon for evaluation of microscopic                            colitis.                           There is no endoscopic evidence of polyps in the                            entire colon.                           The entire examined colon appeared normal on direct                            and retroflexion views. Complications:            No immediate complications. Estimated Blood Loss:     Estimated blood loss was minimal. Impression:               - The examined portion of the ileum was normal.                           - Normal mucosa in the entire examined colon.                            Biopsied.                           - The entire examined colon is normal on direct and                            retroflexion views.                           Overall clinical picture most consistent with  IBS                            (similar symptoms and prior workup in 2009) Recommendation:           - Patient has a contact number available for                            emergencies. The signs and symptoms of potential                            delayed complications were discussed with the                            patient. Return to normal activities tomorrow.                            Written discharge instructions were provided to the  patient.                           - Resume previous diet.                           - Continue present medications.                           - Await pathology results.                           - Repeat colonoscopy in 10 years for screening                            purposes.                           - If biopsies normal, will Rx empiric trial of                            rifaximin for possible SIBO.                           - Follow up in my office will be arranged. Candice Kurdziel L. Loletha Carrow, MD 01/04/2021 12:07:42 PM This report has been signed electronically.

## 2021-01-04 NOTE — Progress Notes (Signed)
1142 HR > 100 with esmolol 25 mg given IV, MD updated, vss

## 2021-01-04 NOTE — Progress Notes (Signed)
Called to room to assist during endoscopic procedure.  Patient ID and intended procedure confirmed with present staff. Received instructions for my participation in the procedure from the performing physician.  

## 2021-01-04 NOTE — Patient Instructions (Signed)
Resume previous diet and continue current medications. Repeat Colonoscopy in 10 years for surveillance.  YOU HAD AN ENDOSCOPIC PROCEDURE TODAY AT [facility]:   Refer to the procedure report that was given to you for any specific questions about what was found during the examination.  If the procedure report does not answer your questions, please call your gastroenterologist to clarify.  If you requested that your care partner not be given the details of your procedure findings, then the procedure report has been included in a sealed envelope for you to review at your convenience later.  YOU SHOULD EXPECT: Some feelings of bloating in the abdomen. Passage of more gas than usual.  Walking can help get rid of the air that was put into your GI tract during the procedure and reduce the bloating. If you had a lower endoscopy (such as a colonoscopy or flexible sigmoidoscopy) you may notice spotting of blood in your stool or on the toilet paper. If you underwent a bowel prep for your procedure, you may not have a normal bowel movement for a few days.  Please Note:  You might notice some irritation and congestion in your nose or some drainage.  This is from the oxygen used during your procedure.  There is no need for concern and it should clear up in a day or so.  SYMPTOMS TO REPORT IMMEDIATELY:  Following lower endoscopy (colonoscopy or flexible sigmoidoscopy):  Excessive amounts of blood in the stool  Significant tenderness or worsening of abdominal pains  Swelling of the abdomen that is new, acute  Fever of 100F or higher  For urgent or emergent issues, a gastroenterologist can be reached at any hour by calling [phone]. Do not use MyChart messaging for urgent concerns.    DIET:  We do recommend a small meal at first, but then you may proceed to your regular diet.  Drink plenty of fluids but you should avoid alcoholic beverages for 24 hours.  ACTIVITY:  You should plan to  take it easy for the rest of today and you should NOT DRIVE or use heavy machinery until tomorrow (because of the sedation medicines used during the test).    FOLLOW UP: Our staff will call the number listed on your records 48-72 hours following your procedure to check on you and address any questions or concerns that you may have regarding the information given to you following your procedure. If we do not reach you, we will leave a message.  We will attempt to reach you two times.  During this call, we will ask if you have developed any symptoms of COVID 19. If you develop any symptoms (ie: fever, flu-like symptoms, shortness of breath, cough etc.) before then, please call [phone].  If you test positive for Covid 19 in the 2 weeks post procedure, please call and report this information to [place].    If any biopsies were taken you will be contacted by phone or by letter within the next 1-3 weeks.  Please call us at [phone] if you have not heard about the biopsies in 3 weeks.    SIGNATURES/CONFIDENTIALITY: You and/or your care partner have signed paperwork which will be entered into your electronic medical record.  These signatures attest to the fact that that the information above on your After Visit Summary has been reviewed and is understood.  Full responsibility of the confidentiality of this discharge information lies with you and/or your care-partner.

## 2021-01-05 ENCOUNTER — Ambulatory Visit
Admission: RE | Admit: 2021-01-05 | Discharge: 2021-01-05 | Disposition: A | Discharge status: Home / Self Care | Payer: 59 | Source: Ambulatory Visit | Attending: Nurse Practitioner | Admitting: Nurse Practitioner

## 2021-01-05 ENCOUNTER — Telehealth: Payer: Self-pay

## 2021-01-05 DIAGNOSIS — R0602 Shortness of breath: Secondary | ICD-10-CM

## 2021-01-05 NOTE — Telephone Encounter (Signed)
Per 01/04/21 procedure report - Follow up in my office will be arranged  My chart message sent to patient to coordinate a follow up appt.

## 2021-01-05 NOTE — Telephone Encounter (Signed)
See my chart messages with patient. Patient has been scheduled for a follow up with Dr. Loletha Carrow on Friday, 02/16/21 at 9 am.

## 2021-01-08 ENCOUNTER — Encounter: Payer: Self-pay | Admitting: Nurse Practitioner

## 2021-01-08 NOTE — Progress Notes (Signed)
Normal chest x-ray. My chart message sent to patient.

## 2021-01-09 ENCOUNTER — Other Ambulatory Visit: Payer: Self-pay

## 2021-01-09 MED ORDER — RIFAXIMIN 550 MG PO TABS: 550.0000 mg | ORAL_TABLET | Freq: Three times a day (TID) | ORAL | 0 refills | Status: AC

## 2021-01-16 ENCOUNTER — Other Ambulatory Visit: Payer: Self-pay | Admitting: Nurse Practitioner

## 2021-01-16 ENCOUNTER — Other Ambulatory Visit: Payer: Self-pay | Admitting: Family

## 2021-01-16 DIAGNOSIS — M109 Gout, unspecified: Secondary | ICD-10-CM

## 2021-01-16 DIAGNOSIS — R739 Hyperglycemia, unspecified: Secondary | ICD-10-CM

## 2021-01-16 DIAGNOSIS — E1165 Type 2 diabetes mellitus with hyperglycemia: Secondary | ICD-10-CM

## 2021-02-16 ENCOUNTER — Ambulatory Visit: Payer: 59 | Admitting: Gastroenterology

## 2021-02-16 ENCOUNTER — Telehealth: Payer: Self-pay | Admitting: Gastroenterology

## 2021-02-16 NOTE — Telephone Encounter (Signed)
Good Morning Dr. Loletha Carrow,  Patient called and canceled her appointment for today with you at 9:00 due to waking up sick this morning.  Patient was rescheduled for 2/3 at 2:00

## 2021-02-22 ENCOUNTER — Other Ambulatory Visit: Payer: Self-pay | Admitting: Neurology

## 2021-02-26 ENCOUNTER — Other Ambulatory Visit: Payer: Self-pay | Admitting: Nurse Practitioner

## 2021-02-26 ENCOUNTER — Other Ambulatory Visit: Payer: Self-pay | Admitting: Neurology

## 2021-02-26 DIAGNOSIS — E1165 Type 2 diabetes mellitus with hyperglycemia: Secondary | ICD-10-CM

## 2021-02-26 DIAGNOSIS — R739 Hyperglycemia, unspecified: Secondary | ICD-10-CM

## 2021-02-27 ENCOUNTER — Other Ambulatory Visit: Payer: Self-pay | Admitting: *Deleted

## 2021-02-27 ENCOUNTER — Other Ambulatory Visit: Payer: Self-pay | Admitting: Nurse Practitioner

## 2021-02-27 DIAGNOSIS — E1165 Type 2 diabetes mellitus with hyperglycemia: Secondary | ICD-10-CM

## 2021-02-27 NOTE — Progress Notes (Deleted)
Dr.Ahern pt is asking for a refill on her methylprednisolone. Started prescription. Please Advise

## 2021-02-27 NOTE — Progress Notes (Signed)
D/C Flexeril pt no longer takes . Pt states flexeril made her feel funny

## 2021-03-06 ENCOUNTER — Telehealth: Payer: Self-pay | Admitting: Adult Health

## 2021-03-06 ENCOUNTER — Other Ambulatory Visit: Payer: Self-pay

## 2021-03-06 ENCOUNTER — Telehealth: Payer: Self-pay | Admitting: Neurology

## 2021-03-06 ENCOUNTER — Ambulatory Visit (INDEPENDENT_AMBULATORY_CARE_PROVIDER_SITE_OTHER): Payer: 59 | Admitting: Adult Health

## 2021-03-06 DIAGNOSIS — G43709 Chronic migraine without aura, not intractable, without status migrainosus: Secondary | ICD-10-CM

## 2021-03-06 NOTE — Telephone Encounter (Signed)
Called pt and sent mychart message asking pt to let us know if she'd like to schedule an office visit.

## 2021-03-06 NOTE — Progress Notes (Signed)
03/06/20: reports that she has about 3-4 headaches a month. Slightly increased but reports that she has a lot of stress with her job right now.   BOTOX PROCEDURE NOTE FOR MIGRAINE HEADACHE    Contraindications and precautions discussed with patient(above). Aseptic procedure was observed and patient tolerated procedure. Procedure performed by Ward Givens, NP  The condition has existed for more than 6 months, and pt does not have a diagnosis of ALS, Myasthenia Gravis or Lambert-Eaton Syndrome.  Risks and benefits of injections discussed and pt agrees to proceed with the procedure.  Written consent obtained  These injections are medically necessary.These injections do not cause sedations or hallucinations which the oral therapies may cause.  Indication/Diagnosis: chronic migraine BOTOX(J0585) injection was performed according to protocol by [employer]. 200 units of BOTOX was dissolved into 4 cc NS.   NDC: [address]-40  Type of toxin: Botox Botox- 100 units x 2 vial Lot: [id number] Expiration: 06/2022 NDC: 5093-2671-24   Bacteriostatic 0.9% Sodium Chloride- 41mL total Lot: [id number] Expiration: 10/03/2022 NDC: 3382-5053-97   Dx: Q73.419   Description of procedure:  The patient was placed in a sitting position. The standard protocol was used for Botox as follows, with 5 units of Botox injected at each site:   -Procerus muscle, midline injection  -Corrugator muscle, bilateral injection  -Frontalis muscle, bilateral injection, with 2 sites each side, medial injection was performed in the upper one third of the frontalis muscle, in the region vertical from the medial inferior edge of the superior orbital rim. The lateral injection was again in the upper one third of the forehead vertically above the lateral limbus of the cornea, 1.5 cm lateral to the medial injection site.  -Temporalis muscle injection, 4 sites, bilaterally. The first injection was 3 cm above the tragus of the ear,  second injection site was 1.5 cm to 3 cm up from the first injection site in line with the tragus of the ear. The third injection site was 1.5-3 cm forward between the first 2 injection sites. The fourth injection site was 1.5 cm posterior to the second injection site.  -Occipitalis muscle injection, 3 sites, bilaterally. The first injection was done one half way between the occipital protuberance and the tip of the mastoid process behind the ear. The second injection site was done lateral and superior to the first, 1 fingerbreadth from the first injection. The third injection site was 1 fingerbreadth superiorly and medially from the first injection site.  -Cervical paraspinal muscle injection, 2 sites, bilateral knee first injection site was 1 cm from the midline of the cervical spine, 3 cm inferior to the lower border of the occipital protuberance. The second injection site was 1.5 cm superiorly and laterally to the first injection site.  -Trapezius muscle injection was performed at 3 sites, bilaterally. The first injection site was in the upper trapezius muscle halfway between the inflection point of the neck, and the acromion. The second injection site was one half way between the acromion and the first injection site. The third injection was done between the first injection site and the inflection point of the neck.   Will return for repeat injection in 3 months.   A 200 unit sof Botox was used, 155 units were injected, the rest of the Botox was wasted. The patient tolerated the procedure well, there were no complications of the above procedure.  Ward Givens, MSN, NP-C 03/06/2021, 11:25 AM [facility] [address] [place], [place] [address] [phone])  [phone] ° °

## 2021-03-06 NOTE — Telephone Encounter (Signed)
Pt will need to schedule an office visit if she is wishing to discuss issues rather than just do her Botox with Dr. Jaynee Eagles in 3 months.

## 2021-03-06 NOTE — Telephone Encounter (Signed)
FYI: Pt requested change for next Botox appointment to be with Dr. Jaynee Eagles as she "has not seen her in awhile".

## 2021-03-06 NOTE — Progress Notes (Signed)
Botox- 100 units x 2 vial Lot: [id number] Expiration: 06/2022 NDC: 0164-2903-79  Bacteriostatic 0.9% Sodium Chloride- 33mL total Lot: [id number] Expiration: 10/03/2022 NDC: 7425-5258-94  Dx: Q34.758 S/P

## 2021-03-06 NOTE — Telephone Encounter (Signed)
Pt is requesting an itemized bill from today's visit to be mailed to her. Thank you.

## 2021-03-06 NOTE — Telephone Encounter (Signed)
Guy Begin do you mind scheduling?

## 2021-03-19 ENCOUNTER — Encounter: Payer: Self-pay | Admitting: Neurology

## 2021-03-20 ENCOUNTER — Other Ambulatory Visit: Payer: Self-pay | Admitting: Neurology

## 2021-03-28 DIAGNOSIS — Z0289 Encounter for other administrative examinations: Secondary | ICD-10-CM

## 2021-03-29 ENCOUNTER — Other Ambulatory Visit: Payer: Self-pay | Admitting: Nurse Practitioner

## 2021-03-29 DIAGNOSIS — R739 Hyperglycemia, unspecified: Secondary | ICD-10-CM

## 2021-03-29 DIAGNOSIS — E1165 Type 2 diabetes mellitus with hyperglycemia: Secondary | ICD-10-CM

## 2021-04-02 ENCOUNTER — Encounter: Payer: Self-pay | Admitting: Nurse Practitioner

## 2021-04-04 ENCOUNTER — Telehealth: Payer: Self-pay | Admitting: *Deleted

## 2021-04-04 NOTE — Telephone Encounter (Signed)
ADA form completed, signed, and sent to medical records for processing.

## 2021-04-06 ENCOUNTER — Encounter: Payer: Self-pay | Admitting: Gastroenterology

## 2021-04-06 ENCOUNTER — Ambulatory Visit (INDEPENDENT_AMBULATORY_CARE_PROVIDER_SITE_OTHER): Payer: 59 | Admitting: Gastroenterology

## 2021-04-06 VITALS — BP 160/88 | HR 90 | Ht 70.0 in | Wt 256.0 lb

## 2021-04-06 DIAGNOSIS — R194 Change in bowel habit: Secondary | ICD-10-CM | POA: Diagnosis not present

## 2021-04-06 DIAGNOSIS — R197 Diarrhea, unspecified: Secondary | ICD-10-CM

## 2021-04-06 DIAGNOSIS — R140 Abdominal distension (gaseous): Secondary | ICD-10-CM

## 2021-04-06 NOTE — Patient Instructions (Addendum)
If you are age 44 or older, your body mass index should be between 23-30. Your Body mass index is 36.73 kg/m. If this is out of the aforementioned range listed, please consider follow up with your Primary Care Provider.  If you are age 33 or younger, your body mass index should be between 19-25. Your Body mass index is 36.73 kg/m. If this is out of the aformentioned range listed, please consider follow up with your Primary Care Provider.   ________________________________________________________  The [place] [facility] providers would like to encourage you to use [facility] to communicate with providers for non-urgent requests or questions.  Due to long hold times on the telephone, sending your provider a message by [facility] may be a faster and more efficient way to get a response.  Please allow 48 business hours for a response.  Please remember that this is for non-urgent requests.  _______________________________________________________  It was a pleasure to see you today!  Thank you for trusting me with your gastrointestinal care!    _____  __________________________________________________  Food Guidelines for those with chronic digestive trouble:  Many people have difficulty digesting certain foods, causing a variety of distressing and embarrassing symptoms such as abdominal pain, bloating and gas.  These foods may need to be avoided or consumed in small amounts.  Here are some tips that might be helpful for you.  1.   Lactose intolerance is the difficulty or complete inability to digest lactose, the natural sugar in milk and anything made from milk.  This condition is harmless, common, and can begin any time during life.  Some people can digest a modest amount of lactose while others cannot tolerate any.  Also, not all dairy products contain equal amounts of lactose.  For example, hard cheeses such as parmesan have less lactose than soft cheeses such as cheddar.  Yogurt has less lactose than milk or  cheese.  Many packaged foods (even many brands of bread) have milk, so read ingredient lists carefully.  It is difficult to test for lactose intolerance, so just try avoiding lactose as much as possible for a week and see what happens with your symptoms.  If you seem to be lactose intolerant, the best plan is to avoid it (but make sure you get calcium from another source).  The next best thing is to use lactase enzyme supplements, available over the counter everywhere.  Just know that many lactose intolerant people need to take several tablets with each serving of dairy to avoid symptoms.  Lastly, a lot of restaurant food is made with milk or butter.  Many are things you might not suspect, such as mashed potatoes, rice and pasta (cooked with butter) and "grilled" items.  If you are lactose intolerant, it never hurts to ask your server what has milk or butter.  2.   Fiber is an important part of your diet, but not all fiber is well-tolerated.  Insoluble fiber such as bran is often consumed by normal gut bacteria and converted into gas.  Soluble fiber such as oats, squash, carrots and green beans are typically tolerated better.  3.   Some types of carbohydrates can be poorly digested.  Examples include: fructose (apples, cherries, pears, raisins and other dried fruits), fructans (onions, zucchini, large amounts of wheat), sorbitol/mannitol/xylitol and sucralose/Splenda (common artificial sweeteners), and raffinose (lentils, broccoli, cabbage, asparagus, brussel sprouts, many types of beans).  Do a Development worker, community for [employer] and you will find helpful information. Beano, a dietary supplement,  will often help with raffinose-containing foods.  As with lactase tablets, you may need several per serving.  4.   Whenever possible, avoid processed food&meats and chemical additives.  High fructose corn syrup, a common sweetener, may be difficult to digest.  Eggs and soy (comes from the soybean, and added to many foods  now) are other common bloating/gassy foods.  5.  Regarding gluten:  gluten is a protein mainly found in wheat, but also rye and barley.  There is a condition called celiac sprue, which is an inflammatory reaction in the small intestine causing a variety of digestive symptoms.  Blood testing is highly reliable to look for this condition, and sometimes upper endoscopy with small bowel biopsies may be necessary to make the diagnosis.  Many patients who test negative for celiac sprue report improvement in their digestive symptoms when they switch to a gluten-free diet.  However, in these "non-celiac gluten sensitive" patients, the true role of gluten in their symptoms is unclear.  Reducing carbohydrates in general may decrease the gas and bloating caused when gut bacteria consume carbs. Also, some of these patients may actually be intolerant of the baker's yeast in bread products rather than the gluten.  Flatbread and other reduced yeast breads might therefore be tolerated.  There is no specific testing available for most food intolerances, which are discovered mainly by dietary elimination.  Please do not embark on a gluten free diet unless directed by your doctor, as it is highly restrictive, and may lead to nutritional deficiencies if not carefully monitored.  Lastly, beware of internet claims offering "personalized" tests for food intolerances.  Such testing has no reliable scientific evidence to support its reliability and correlation to symptoms.    6.  The best advice is old advice, especially for those with chronic digestive trouble - try to eat "clean".  Balanced diet, avoid processed food, plenty of fruits and vegetables, cut down the sugar, minimal alcohol, avoid tobacco. Make time to care for yourself, get enough sleep, exercise when you can, reduce stress.  Your guts will thank you for it.   - Dr. Herma Ard  Gastroenterology  ____________________________________________________________

## 2021-04-06 NOTE — Progress Notes (Signed)
° ° ° °  [facility] Progress Note  Chief Complaint: Abdominal bloating and diarrhea  Subjective  History: I saw [facility] in early October last year for predominantly left lower quadrant pain, bloating and diarrhea.  (She had seen Dr. Sharlett Iles in the past for suspected IBS).  Colonoscopy 01/04/21 normal to the terminal ileum, biopsies negative for microscopic colitis.  Patient given empiric 14-day course of rifaximin.  Tyjah is glad to report she is feeling much better overall.  Bloating significantly decreased, now described as slight.  Diarrhea resolved, no further abdominal pain and her energy level is improved.  ROS: Cardiovascular:  no chest pain Respiratory: no dyspnea  The patient's Past Medical, Family and Social History were reviewed and are on file in the EMR.  Objective:  Med list reviewed  Current Outpatient Medications:    ALPRAZolam (XANAX) 1 MG tablet, Take 0.5 mg by mouth as needed., Disp: , Rfl:    BOTOX 100 units SOLR injection, INJECT 155 UNITS  INTRAMUSCULARLY INTO HEAD  AND NECK AS DIRECTED (GIVEN AT MD OFFICE, DISCARD  UNUSED), Disp: 2 each, Rfl: 1   Multiple Vitamin (MULTIVITAMIN) tablet, Take 1 tablet by mouth daily., Disp: , Rfl:    NURTEC 75 MG TBDP, TAKE 1 TAB BY MOUTH DAILY AS NEEDED. FOR MIGRAINES. TAKE AS CLOSE TO ONSET OF MIGRAINE AS POSSIBLE, Disp: 10 tablet, Rfl: 6   OZEMPIC, 1 MG/DOSE, 4 MG/3ML SOPN, INJECT 1MG  AS DIRECTED ONCE A WEEK. NEEDS APPOINTMENT FOR FUTURE REFILLS, Disp: 12 mL, Rfl: 1   tretinoin (RETIN-A) 0.05 % cream, Apply 1 application topically every other day., Disp: , Rfl:    albuterol (PROVENTIL HFA;VENTOLIN HFA) 108 (90 Base) MCG/ACT inhaler, Inhale 1-2 puffs into the lungs every 6 (six) hours as needed for wheezing or shortness of breath., Disp: 1 Inhaler, Rfl: 0   Vital signs in last 24 hrs: Vitals:   04/06/21 1359  BP: (!) 160/88  Pulse: 90   Wt Readings from Last 3 Encounters:  04/06/21 256 lb (116.1 kg)  01/04/21 257  lb (116.6 kg)  12/06/20 257 lb 8 oz (116.8 kg)    Physical Exam  Well-appearing, no additional exam, entire visit spent in discussion  Labs:   ___________________________________________ Radiologic studies:   ____________________________________________ Other:  Pathology report as noted above _____________________________________________ Assessment & Plan  Assessment: Encounter Diagnoses  Name Primary?   Altered bowel habits Yes   Diarrhea, unspecified type    Abdominal bloating    She appears to have had SIBO, considerably improved after a course of empiric rifaximin. We discussed the nature and variable natural history of that condition, and she may have recurrent symptoms at some point requiring another course of therapy.  Kacy has been taking Metamucil lately, and I recommend a change to Citrucel as it tends to be less bloating. She will see me as needed.     Nelida Meuse III

## 2021-04-24 ENCOUNTER — Other Ambulatory Visit: Payer: Self-pay | Admitting: Neurology

## 2021-04-25 ENCOUNTER — Encounter: Payer: Self-pay | Admitting: Neurology

## 2021-04-26 ENCOUNTER — Other Ambulatory Visit: Payer: Self-pay

## 2021-04-26 ENCOUNTER — Encounter: Payer: Self-pay | Admitting: Nurse Practitioner

## 2021-04-26 ENCOUNTER — Ambulatory Visit (INDEPENDENT_AMBULATORY_CARE_PROVIDER_SITE_OTHER): Payer: 59 | Admitting: Nurse Practitioner

## 2021-04-26 VITALS — BP 138/82 | HR 97 | Temp 98.0°F | Ht 70.0 in | Wt 255.0 lb

## 2021-04-26 DIAGNOSIS — U071 COVID-19: Secondary | ICD-10-CM | POA: Diagnosis not present

## 2021-04-26 DIAGNOSIS — J069 Acute upper respiratory infection, unspecified: Secondary | ICD-10-CM | POA: Diagnosis not present

## 2021-04-26 DIAGNOSIS — E1165 Type 2 diabetes mellitus with hyperglycemia: Secondary | ICD-10-CM

## 2021-04-26 DIAGNOSIS — R5383 Other fatigue: Secondary | ICD-10-CM

## 2021-04-26 HISTORY — DX: COVID-19: U07.1

## 2021-04-26 MED ORDER — METHYLPREDNISOLONE 4 MG PO TBPK: ORAL_TABLET | ORAL | 0 refills | Status: DC

## 2021-04-26 NOTE — Progress Notes (Addendum)
Established patient visit   Patient: Candice [name]   DOB: April 25, 1975   46 y.o. Female  MRN: [id number] Visit Date: 04/26/2021  Chief Complaint  Patient presents with   Fatigue   Subjective    HPI  The patient states that she had diagnosis of COVID 19 on 04/16/2021. She states that she was getting ready to go back to work today, but is continuing to have severe fatigue and "brain fog." She is having trouble getting out of bed to function. States that she has had low grade fever every evening. She has taken augmentin 833m twice daily for 7 days. Also took z-pack. Continues to feel weak, tired, and very unmotivated to do normal activities of daily living.   Medications: Outpatient Medications Prior to Visit  Medication Sig   ALPRAZolam (XANAX) 1 MG tablet Take 0.5 mg by mouth as needed.   azithromycin (ZITHROMAX) 250 MG tablet TAKE 2 TABLETS BY MOUTH TODAY, THEN TAKE 1 TABLET DAILY FOR 4 DAYS   BOTOX 100 units SOLR injection INJECT 155 UNITS  INTRAMUSCULARLY INTO HEAD  AND NECK AS DIRECTED (GIVEN AT MD OFFICE, DISCARD  UNUSED)   metFORMIN (GLUCOPHAGE) 500 MG tablet Take by mouth 2 (two) times daily with a meal.   Multiple Vitamin (MULTIVITAMIN) tablet Take 1 tablet by mouth daily.   naproxen (NAPROSYN) 500 MG tablet naproxen 500 mg tablet  TAKE 1 TABLET BY MOUTH TWICE A DAY WITH FOOD AS NEEDED FOR PAIN   NURTEC 75 MG TBDP TAKE 1 TAB BY MOUTH DAILY AS NEEDED. FOR MIGRAINES. TAKE AS CLOSE TO ONSET OF MIGRAINE AS POSSIBLE   OZEMPIC, 1 MG/DOSE, 4 MG/3ML SOPN INJECT 1MG AS DIRECTED ONCE A WEEK. NEEDS APPOINTMENT FOR FUTURE REFILLS   tretinoin (RETIN-A) 0.05 % cream Apply 1 application topically every other day.   albuterol (PROVENTIL HFA;VENTOLIN HFA) 108 (90 Base) MCG/ACT inhaler Inhale 1-2 puffs into the lungs every 6 (six) hours as needed for wheezing or shortness of breath.   No facility-administered medications prior to visit.    Review of Systems  Constitutional:  Positive for  fatigue. Negative for activity change, appetite change, chills and fever.  HENT:  Positive for congestion and postnasal drip. Negative for rhinorrhea, sinus pressure, sinus pain, sneezing and sore throat.   Eyes: Negative.   Respiratory:  Negative for cough, chest tightness, shortness of breath and wheezing.   Cardiovascular:  Negative for chest pain and palpitations.  Gastrointestinal:  Negative for abdominal pain, constipation, diarrhea, nausea and vomiting.  Endocrine: Negative for cold intolerance, heat intolerance, polydipsia and polyuria.  Genitourinary:  Negative for dyspareunia, dysuria, flank pain, frequency and urgency.  Musculoskeletal:  Negative for arthralgias, back pain and myalgias.  Skin:  Negative for rash.  Allergic/Immunologic: Negative for environmental allergies.  Neurological:  Positive for headaches. Negative for dizziness and weakness.  Hematological:  Negative for adenopathy.  Psychiatric/Behavioral:  The patient is not nervous/anxious.     Objective     Today's Vitals   04/26/21 0903  BP: 138/82  Pulse: 97  Temp: 98 F (36.7 C)  SpO2: 98%  Weight: 255 lb (115.7 kg)   Body mass index is 36.59 kg/m.   Physical Exam Vitals and nursing note reviewed.  Constitutional:      Appearance: Normal appearance. She is well-developed. She is ill-appearing.  HENT:     Head: Normocephalic and atraumatic.     Right Ear: Hearing, ear canal and external ear normal. Tympanic membrane is bulging.  Left Ear: Hearing, ear canal and external ear normal. Tympanic membrane is bulging.     Nose: Congestion present.     Right Sinus: No maxillary sinus tenderness or frontal sinus tenderness.     Left Sinus: No maxillary sinus tenderness or frontal sinus tenderness.     Mouth/Throat:     Pharynx: Posterior oropharyngeal erythema present.  Eyes:     Pupils: Pupils are equal, round, and reactive to light.  Cardiovascular:     Rate and Rhythm: Normal rate and regular rhythm.      Pulses: Normal pulses.     Heart sounds: Normal heart sounds.  Pulmonary:     Effort: Pulmonary effort is normal.     Breath sounds: Normal breath sounds.  Abdominal:     Palpations: Abdomen is soft.  Musculoskeletal:        General: Normal range of motion.     Cervical back: Normal range of motion and neck supple.  Lymphadenopathy:     Cervical: No cervical adenopathy.  Skin:    General: Skin is warm and dry.     Capillary Refill: Capillary refill takes less than 2 seconds.  Neurological:     General: No focal deficit present.     Mental Status: She is alert and oriented to person, place, and time.  Psychiatric:        Mood and Affect: Mood normal.        Behavior: Behavior normal.        Thought Content: Thought content normal.        Judgment: Judgment normal.     Results for orders placed or performed in visit on 78/93/81  Basic Metabolic Panel (BMET)  Result Value Ref Range   Glucose 222 (H) 70 - 99 mg/dL   BUN 9 6 - 24 mg/dL   Creatinine, Ser 0.59 0.57 - 1.00 mg/dL   eGFR 115 >59 mL/min/1.73   BUN/Creatinine Ratio 15 9 - 23   Sodium 139 134 - 144 mmol/L   Potassium 4.7 3.5 - 5.2 mmol/L   Chloride 101 96 - 106 mmol/L   CO2 22 20 - 29 mmol/L   Calcium 9.7 8.7 - 10.2 mg/dL  CBC  Result Value Ref Range   WBC 9.0 3.4 - 10.8 x10E3/uL   RBC 5.43 (H) 3.77 - 5.28 x10E6/uL   Hemoglobin 12.1 11.1 - 15.9 g/dL   Hematocrit 40.2 34.0 - 46.6 %   MCV 74 (L) 79 - 97 fL   MCH 22.3 (L) 26.6 - 33.0 pg   MCHC 30.1 (L) 31.5 - 35.7 g/dL   RDW 14.7 11.7 - 15.4 %   Platelets 308 150 - 450 x10E3/uL  Hemoglobin A1c  Result Value Ref Range   Hgb A1c MFr Bld 9.1 (H) 4.8 - 5.6 %   Est. average glucose Bld gHb Est-mCnc 214 mg/dL    Assessment & Plan     1. Upper respiratory tract infection due to COVID-19 virus Patient with lingering upper respiratory symptoms after having COVID-19.  Prescription for Medrol taper given today.  Take as directed for neck 6 days.  Encouraged her to  use over-the-counter medications as needed and as directed for acute symptoms.  Advised her to monitor her blood sugars closely as Medrol and oral steroids can increase her blood sugars.  She voiced understanding.  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 - methylPREDNISolone (MEDROL) 4 MG TBPK tablet; Take by mouth as directed for 6 days  Dispense: 21 tablet; Refill: 0  2. Fatigue, unspecified type This is likely related to recent COVID-19 infection.  Check CBC, BMP, hemoglobin A1c for further evaluation. - CBC; Future - Basic Metabolic Panel (BMET); Future - Hemoglobin A1c; Future - Basic Metabolic Panel (BMET) - CBC - Hemoglobin A1c  3. Type 2 diabetes mellitus with hyperglycemia, without long-term current use of insulin ([place]) Advised patient to monitor her blood sugars closely while taking Medrol taper as Medrol is likely to increase her blood sugars.  She voiced understanding.  Check hemoglobin A1c.  Adjust treatment as indicated. - CBC; Future - Basic Metabolic Panel (BMET); Future - Hemoglobin A1c; Future - Basic Metabolic Panel (BMET) - CBC - Hemoglobin A1c   Return for prn worsening or persistent symptoms.        Ronnell Freshwater, NP  [facility] [phone] (phone) [phone] (fax)  [place]

## 2021-04-27 LAB — BASIC METABOLIC PANEL
BUN/Creatinine Ratio: 15 (ref 9–23)
BUN: 9 mg/dL (ref 6–24)
CO2: 22 mmol/L (ref 20–29)
Calcium: 9.7 mg/dL (ref 8.7–10.2)
Chloride: 101 mmol/L (ref 96–106)
Creatinine, Ser: 0.59 mg/dL (ref 0.57–1.00)
Glucose: 222 mg/dL — ABNORMAL HIGH (ref 70–99)
Potassium: 4.7 mmol/L (ref 3.5–5.2)
Sodium: 139 mmol/L (ref 134–144)
eGFR: 115 mL/min/{1.73_m2} (ref >59)

## 2021-04-27 LAB — CBC
Hematocrit: 40.2 % (ref 34.0–46.6)
Hemoglobin: 12.1 g/dL (ref 11.1–15.9)
MCH: 22.3 pg — ABNORMAL LOW (ref 26.6–33.0)
MCHC: 30.1 g/dL — ABNORMAL LOW (ref 31.5–35.7)
MCV: 74 fL — ABNORMAL LOW (ref 79–97)
Platelets: 308 10*3/uL (ref 150–450)
RBC: 5.43 x10E6/uL — ABNORMAL HIGH (ref 3.77–5.28)
RDW: 14.7 % (ref 11.7–15.4)
WBC: 9 10*3/uL (ref 3.4–10.8)

## 2021-04-27 LAB — HEMOGLOBIN A1C
Est. average glucose Bld gHb Est-mCnc: 214 mg/dL
Hgb A1c MFr Bld: 9.1 % — ABNORMAL HIGH (ref 4.8–5.6)

## 2021-04-29 ENCOUNTER — Encounter: Payer: Self-pay | Admitting: Nurse Practitioner

## 2021-04-29 DIAGNOSIS — U071 COVID-19: Secondary | ICD-10-CM | POA: Insufficient documentation

## 2021-05-01 ENCOUNTER — Encounter: Payer: Self-pay | Admitting: Nurse Practitioner

## 2021-05-01 NOTE — Progress Notes (Signed)
MyChart message sent to patient regarding results.

## 2021-05-02 ENCOUNTER — Other Ambulatory Visit: Payer: Self-pay | Admitting: Nurse Practitioner

## 2021-05-02 DIAGNOSIS — E1165 Type 2 diabetes mellitus with hyperglycemia: Secondary | ICD-10-CM

## 2021-05-09 ENCOUNTER — Other Ambulatory Visit: Payer: Self-pay | Admitting: Nurse Practitioner

## 2021-05-09 DIAGNOSIS — E1165 Type 2 diabetes mellitus with hyperglycemia: Secondary | ICD-10-CM

## 2021-05-09 MED ORDER — METFORMIN HCL 500 MG PO TABS: 500.0000 mg | ORAL_TABLET | Freq: Two times a day (BID) | ORAL | 1 refills | Status: DC

## 2021-05-09 NOTE — Progress Notes (Signed)
Sent metformin '500mg'$  bid to [facility] in [place].  ?

## 2021-05-11 ENCOUNTER — Encounter: Payer: Self-pay | Admitting: Nurse Practitioner

## 2021-06-04 ENCOUNTER — Ambulatory Visit (INDEPENDENT_AMBULATORY_CARE_PROVIDER_SITE_OTHER): Payer: 59 | Admitting: Neurology

## 2021-06-04 ENCOUNTER — Ambulatory Visit: Payer: 59 | Admitting: Adult Health

## 2021-06-04 DIAGNOSIS — G43709 Chronic migraine without aura, not intractable, without status migrainosus: Secondary | ICD-10-CM | POA: Diagnosis not present

## 2021-06-04 NOTE — Progress Notes (Signed)
Botox- 200 units x 1 vial ?Lot: [id number] ?Expiration: 07/2023  ?[place]: [phone] ? ?Bacteriostatic 0.9% Sodium Chloride- 69m total ?Lot: [id number]?Expiration: 10/03/2022 ?NJet [address]-40? ?Dx: GN02.725?S/P  ? ?

## 2021-06-04 NOTE — Progress Notes (Signed)
06/04/2021: Excellent response, > 80% mprovement in migraine freq and severity ? ? ?Consent Form ?Botulism Toxin Injection For Chronic Migraine ? ? ? ?Reviewed orally with patient, additionally signature is on file: ? ?Botulism toxin has been approved by the Federal drug administration for treatment of chronic migraine. Botulism toxin does not cure chronic migraine and it may not be effective in some patients. ? ?The administration of botulism toxin is accomplished by injecting a small amount of toxin into the muscles of the neck and head. Dosage must be titrated for each individual. Any benefits resulting from botulism toxin tend to wear off after 3 months with a repeat injection required if benefit is to be maintained. Injections are usually done every 3-4 months with maximum effect peak achieved by about 2 or 3 weeks. Botulism toxin is expensive and you should be sure of what costs you will incur resulting from the injection. ? ?The side effects of botulism toxin use for chronic migraine may include: ? ? -Transient, and usually mild, facial weakness with facial injections ? -Transient, and usually mild, head or neck weakness with head/neck injections ? -Reduction or loss of forehead facial animation due to forehead muscle weakness ? -Eyelid drooping ? -Dry eye ? -Pain at the site of injection or bruising at the site of injection ? -Double vision ? -Potential unknown long term risks ? ?Contraindications: You should not have Botox if you are pregnant, nursing, allergic to albumin, have an infection, skin condition, or muscle weakness at the site of the injection, or have myasthenia gravis, Lambert-Eaton syndrome, or ALS. ? ?It is also possible that as with any injection, there may be an allergic reaction or no effect from the medication. Reduced effectiveness after repeated injections is sometimes seen and rarely infection at the injection site may occur. All care will be taken to prevent these side effects. If  therapy is given over a long time, atrophy and wasting in the muscle injected may occur. Occasionally the patient's become refractory to treatment because they develop antibodies to the toxin. In this event, therapy needs to be modified. ? ?I have read the above information and consent to the administration of botulism toxin. ? ? ? ?BOTOX PROCEDURE NOTE FOR MIGRAINE HEADACHE ? ? ? ?Contraindications and precautions discussed with patient(above). Aseptic procedure was observed and patient tolerated procedure. Procedure performed by Dr. Georgia Dom ? ?The condition has existed for more than 6 months, and pt does not have a diagnosis of ALS, Myasthenia Gravis or Lambert-Eaton Syndrome.  Risks and benefits of injections discussed and pt agrees to proceed with the procedure.  Written consent obtained ? ?These injections are medically necessary. Pt  receives good benefits from these injections. These injections do not cause sedations or hallucinations which the oral therapies may cause. ? ?Description of procedure: ? ?The patient was placed in a sitting position. The standard protocol was used for Botox as follows, with 5 units of Botox injected at each site: ? ? ?-Procerus muscle, midline injection ? ?-Corrugator muscle, bilateral injection ? ?-Frontalis muscle, bilateral injection, with 2 sites each side, medial injection was performed in the upper one third of the frontalis muscle, in the region vertical from the medial inferior edge of the superior orbital rim. The lateral injection was again in the upper one third of the forehead vertically above the lateral limbus of the cornea, 1.5 cm lateral to the medial injection site. ? ?-Temporalis muscle injection, 4 sites, bilaterally. The first injection was 3 cm above  the tragus of the ear, second injection site was 1.5 cm to 3 cm up from the first injection site in line with the tragus of the ear. The third injection site was 1.5-3 cm forward between the first 2 injection  sites. The fourth injection site was 1.5 cm posterior to the second injection site.  ? ?-Occipitalis muscle injection, 3 sites, bilaterally. The first injection was done one half way between the occipital protuberance and the tip of the mastoid process behind the ear. The second injection site was done lateral and superior to the first, 1 fingerbreadth from the first injection. The third injection site was 1 fingerbreadth superiorly and medially from the first injection site. ? ?-Cervical paraspinal muscle injection, 2 sites, bilateral knee first injection site was 1 cm from the midline of the cervical spine, 3 cm inferior to the lower border of the occipital protuberance. The second injection site was 1.5 cm superiorly and laterally to the first injection site. ? ?-Trapezius muscle injection was performed at 3 sites, bilaterally. The first injection site was in the upper trapezius muscle halfway between the inflection point of the neck, and the acromion. The second injection site was one half way between the acromion and the first injection site. The third injection was done between the first injection site and the inflection point of the neck. ? ? ?Will return for repeat injection in 3 months. ? ? ?155 units of Botox was used, 45 Botox not injected was wasted. The patient tolerated the procedure well, there were no complications of the above procedure. ?

## 2021-06-22 ENCOUNTER — Other Ambulatory Visit: Payer: Self-pay | Admitting: Neurology

## 2021-07-05 ENCOUNTER — Telehealth: Payer: Self-pay | Admitting: Neurology

## 2021-07-05 MED ORDER — BOTOX 100 UNITS IJ SOLR: INTRAMUSCULAR | 1 refills | Status: DC

## 2021-07-05 NOTE — Telephone Encounter (Signed)
Please send Botox Rx to [facility]. ?

## 2021-07-26 NOTE — Telephone Encounter (Signed)
Received (2) 100 unit vials of Botox from [facility]. ?

## 2021-08-09 ENCOUNTER — Encounter: Payer: Self-pay | Admitting: Neurology

## 2021-08-09 ENCOUNTER — Encounter: Payer: Self-pay | Admitting: *Deleted

## 2021-08-20 ENCOUNTER — Telehealth: Payer: Self-pay | Admitting: Neurology

## 2021-08-20 NOTE — Telephone Encounter (Signed)
Lm to inform pt of her balance of $ 442.99. pt will need to take of balance or at least speak with billing about payment arrangements. If pt is not willing to then her appt will have to be cancelled. I also sent pt a Mychart message also.

## 2021-08-21 ENCOUNTER — Encounter: Payer: Self-pay | Admitting: Neurology

## 2021-08-27 ENCOUNTER — Ambulatory Visit (INDEPENDENT_AMBULATORY_CARE_PROVIDER_SITE_OTHER): Payer: 59 | Admitting: Neurology

## 2021-08-27 DIAGNOSIS — G43709 Chronic migraine without aura, not intractable, without status migrainosus: Secondary | ICD-10-CM

## 2021-08-27 NOTE — Progress Notes (Signed)
08/27/2021: Excellent response, > 80% mprovement in migraine freq and severity. She left [facility] after they changed to increase workload, with many and dr tower   Consent Form Botulism Toxin Injection For Chronic Migraine    Reviewed orally with patient, additionally signature is on file:  Botulism toxin has been approved by the Federal drug administration for treatment of chronic migraine. Botulism toxin does not cure chronic migraine and it may not be effective in some patients.  The administration of botulism toxin is accomplished by injecting a small amount of toxin into the muscles of the neck and head. Dosage must be titrated for each individual. Any benefits resulting from botulism toxin tend to wear off after 3 months with a repeat injection required if benefit is to be maintained. Injections are usually done every 3-4 months with maximum effect peak achieved by about 2 or 3 weeks. Botulism toxin is expensive and you should be sure of what costs you will incur resulting from the injection.  The side effects of botulism toxin use for chronic migraine may include:   -Transient, and usually mild, facial weakness with facial injections  -Transient, and usually mild, head or neck weakness with head/neck injections  -Reduction or loss of forehead facial animation due to forehead muscle weakness  -Eyelid drooping  -Dry eye  -Pain at the site of injection or bruising at the site of injection  -Double vision  -Potential unknown long term risks  Contraindications: You should not have Botox if you are pregnant, nursing, allergic to albumin, have an infection, skin condition, or muscle weakness at the site of the injection, or have myasthenia gravis, Lambert-Eaton syndrome, or ALS.  It is also possible that as with any injection, there may be an allergic reaction or no effect from the medication. Reduced effectiveness after repeated injections is sometimes seen and rarely infection  at the injection site may occur. All care will be taken to prevent these side effects. If therapy is given over a long time, atrophy and wasting in the muscle injected may occur. Occasionally the patient's become refractory to treatment because they develop antibodies to the toxin. In this event, therapy needs to be modified.  I have read the above information and consent to the administration of botulism toxin.    BOTOX PROCEDURE NOTE FOR MIGRAINE HEADACHE    Contraindications and precautions discussed with patient(above). Aseptic procedure was observed and patient tolerated procedure. Procedure performed by Dr. Artemio Aly  The condition has existed for more than 6 months, and pt does not have a diagnosis of ALS, Myasthenia Gravis or Lambert-Eaton Syndrome.  Risks and benefits of injections discussed and pt agrees to proceed with the procedure.  Written consent obtained  These injections are medically necessary. Pt  receives good benefits from these injections. These injections do not cause sedations or hallucinations which the oral therapies may cause.  Description of procedure:  The patient was placed in a sitting position. The standard protocol was used for Botox as follows, with 5 units of Botox injected at each site:   -Procerus muscle, midline injection  -Corrugator muscle, bilateral injection  -Frontalis muscle, bilateral injection, with 2 sites each side, medial injection was performed in the upper one third of the frontalis muscle, in the region vertical from the medial inferior edge of the superior orbital rim. The lateral injection was again in the upper one third of the forehead vertically above the lateral limbus of the cornea, 1.5 cm lateral to the medial  injection site.  -Temporalis muscle injection, 4 sites, bilaterally. The first injection was 3 cm above the tragus of the ear, second injection site was 1.5 cm to 3 cm up from the first injection site in line with the  tragus of the ear. The third injection site was 1.5-3 cm forward between the first 2 injection sites. The fourth injection site was 1.5 cm posterior to the second injection site.   -Occipitalis muscle injection, 3 sites, bilaterally. The first injection was done one half way between the occipital protuberance and the tip of the mastoid process behind the ear. The second injection site was done lateral and superior to the first, 1 fingerbreadth from the first injection. The third injection site was 1 fingerbreadth superiorly and medially from the first injection site.  -Cervical paraspinal muscle injection, 2 sites, bilateral knee first injection site was 1 cm from the midline of the cervical spine, 3 cm inferior to the lower border of the occipital protuberance. The second injection site was 1.5 cm superiorly and laterally to the first injection site.  -Trapezius muscle injection was performed at 3 sites, bilaterally. The first injection site was in the upper trapezius muscle halfway between the inflection point of the neck, and the acromion. The second injection site was one half way between the acromion and the first injection site. The third injection was done between the first injection site and the inflection point of the neck.   Will return for repeat injection in 3 months.   155 units of Botox was used, 45 Botox not injected was wasted. The patient tolerated the procedure well, there were no complications of the above procedure.

## 2021-10-24 ENCOUNTER — Other Ambulatory Visit: Payer: Self-pay | Admitting: Nurse Practitioner

## 2021-10-24 ENCOUNTER — Telehealth: Payer: Self-pay | Admitting: *Deleted

## 2021-10-24 DIAGNOSIS — E1165 Type 2 diabetes mellitus with hyperglycemia: Secondary | ICD-10-CM

## 2021-10-24 NOTE — Telephone Encounter (Signed)
Received a renewal Nurtec PA. Patient has new insurance. I started PA Key: [id number] on Cover My Meds w/ [facility] and received this message from plan: This medication or product is on your plan's list of covered drugs. Prior authorization is not required at this time. If your pharmacy has questions regarding the processing of your prescription, please have them call the [facility] pharmacy help desk at [phone]. **Please note: This request was submitted electronically. Formulary lowering, tiering exception, cost reduction and/or pre-benefit determination review (including prospective Medicare hospice reviews) requests cannot be requested using this method of submission. Providers contact us at [phone] for further assistance.

## 2021-10-26 ENCOUNTER — Encounter: Payer: Self-pay | Admitting: Nurse Practitioner

## 2021-10-26 ENCOUNTER — Ambulatory Visit (INDEPENDENT_AMBULATORY_CARE_PROVIDER_SITE_OTHER): Payer: Self-pay | Admitting: Nurse Practitioner

## 2021-10-26 VITALS — BP 127/89 | HR 100 | Ht 70.0 in | Wt 248.0 lb

## 2021-10-26 DIAGNOSIS — N390 Urinary tract infection, site not specified: Secondary | ICD-10-CM

## 2021-10-26 DIAGNOSIS — E1165 Type 2 diabetes mellitus with hyperglycemia: Secondary | ICD-10-CM

## 2021-10-26 DIAGNOSIS — R319 Hematuria, unspecified: Secondary | ICD-10-CM

## 2021-10-26 DIAGNOSIS — R3989 Other symptoms and signs involving the genitourinary system: Secondary | ICD-10-CM

## 2021-10-26 LAB — POCT URINALYSIS DIP (CLINITEK)
Bilirubin, UA: NEGATIVE
Glucose, UA: 500 mg/dL — AB
Ketones, POC UA: NEGATIVE mg/dL
Leukocytes, UA: NEGATIVE
Nitrite, UA: NEGATIVE
POC PROTEIN,UA: 30 — AB
Spec Grav, UA: >=1.03 — AB (ref 1.010–1.025)
Urobilinogen, UA: 0.2 E.U./dL
pH, UA: 6 (ref 5.0–8.0)

## 2021-10-26 MED ORDER — METFORMIN HCL ER (OSM) 500 MG PO TB24: 500.0000 mg | ORAL_TABLET | Freq: Two times a day (BID) | ORAL | 1 refills | Status: DC

## 2021-10-26 MED ORDER — OZEMPIC (1 MG/DOSE) 4 MG/3ML ~~LOC~~ SOPN
1.0000 mg | PEN_INJECTOR | SUBCUTANEOUS | 1 refills | Status: DC
Start: 2021-10-26 — End: 2021-12-28

## 2021-10-26 MED ORDER — SULFAMETHOXAZOLE-TRIMETHOPRIM 800-160 MG PO TABS: 1.0000 | ORAL_TABLET | Freq: Two times a day (BID) | ORAL | 0 refills | Status: DC

## 2021-10-26 MED ORDER — PHENAZOPYRIDINE HCL 200 MG PO TABS: 200.0000 mg | ORAL_TABLET | Freq: Three times a day (TID) | ORAL | 0 refills | Status: DC | PRN

## 2021-10-26 NOTE — Progress Notes (Signed)
Established patient visit   Patient: Candice [name]   DOB: 09-Apr-1975   46 y.o. Female  MRN: [id number] Visit Date: 10/26/2021  Chief Complaint  Patient presents with   Urinary Tract Infection   Subjective    Dysuria  This is a new problem. The current episode started in the past 7 days. The problem occurs every urination. The problem has been gradually worsening. The quality of the pain is described as burning. There has been no fever. She is Sexually active. There is No history of pyelonephritis. Associated symptoms include flank pain, frequency, hematuria, hesitancy, nausea and urgency. Pertinent negatives include no chills, discharge, sweats or vomiting. She has tried acetaminophen and increased fluids for the symptoms. The treatment provided no relief.     Medications: Outpatient Medications Prior to Visit  Medication Sig   botulinum toxin Type A (BOTOX) 100 units SOLR injection Provider to inject 155 units into the muscles of the head and neck every 3 months. Discard remainder.   Multiple Vitamin (MULTIVITAMIN) tablet Take 1 tablet by mouth daily.   naproxen (NAPROSYN) 500 MG tablet naproxen 500 mg tablet  TAKE 1 TABLET BY MOUTH TWICE A DAY WITH FOOD AS NEEDED FOR PAIN   NURTEC 75 MG TBDP TAKE 1 TAB BY MOUTH DAILY AS NEEDED. FOR MIGRAINES. TAKE AS CLOSE TO ONSET OF MIGRAINE AS POSSIBLE   tretinoin (RETIN-A) 0.05 % cream Apply 1 application topically every other day.   [DISCONTINUED] metFORMIN (GLUCOPHAGE) 500 MG tablet TAKE 1 TABLET BY MOUTH 2 TIMES DAILY WITH A MEAL.   [DISCONTINUED] OZEMPIC, 1 MG/DOSE, 4 MG/3ML SOPN INJECT '1MG'$  AS DIRECTED ONCE A WEEK. NEEDS APPOINTMENT FOR FUTURE REFILLS   [DISCONTINUED] predniSONE (DELTASONE) 20 MG tablet TAKE 3 TABLETS (60 MG TOTAL) BY MOUTH DAILY WITH BREAKFAST.   [DISCONTINUED] ALPRAZolam (XANAX) 1 MG tablet Take 0.5 mg by mouth as needed.   [DISCONTINUED] azithromycin (ZITHROMAX) 250 MG tablet TAKE 2 TABLETS BY MOUTH TODAY, THEN TAKE 1  TABLET DAILY FOR 4 DAYS   [DISCONTINUED] methylPREDNISolone (MEDROL) 4 MG TBPK tablet Take by mouth as directed for 6 days   No facility-administered medications prior to visit.    Review of Systems  Constitutional:  Positive for fatigue. Negative for activity change, appetite change, chills and fever.  HENT:  Negative for congestion, postnasal drip, rhinorrhea, sinus pressure, sinus pain, sneezing and sore throat.   Eyes: Negative.   Respiratory:  Negative for cough, chest tightness, shortness of breath and wheezing.   Cardiovascular:  Negative for chest pain and palpitations.  Gastrointestinal:  Positive for nausea. Negative for abdominal pain, constipation, diarrhea and vomiting.  Endocrine: Negative for cold intolerance, heat intolerance, polydipsia and polyuria.       High blood sugars   Genitourinary:  Positive for dysuria, flank pain, frequency, hematuria, hesitancy and urgency. Negative for dyspareunia.  Musculoskeletal:  Positive for back pain. Negative for arthralgias and myalgias.  Skin:  Negative for rash.  Allergic/Immunologic: Negative for environmental allergies.  Neurological:  Negative for dizziness, weakness and headaches.  Hematological:  Negative for adenopathy.  Psychiatric/Behavioral:  The patient is not nervous/anxious.   All other systems reviewed and are negative.    Objective     Today's Vitals   10/26/21 1044  BP: 127/89  Pulse: 100  SpO2: 100%  Weight: 248 lb (112.5 kg)  Height: '5\' 10"'$  (1.778 m)   Body mass index is 35.58 kg/m.   Physical Exam Vitals and nursing note reviewed.  Constitutional:  Appearance: Normal appearance. She is well-developed.  HENT:     Head: Normocephalic and atraumatic.     Nose: Nose normal.     Mouth/Throat:     Mouth: Mucous membranes are moist.     Pharynx: Oropharynx is clear.  Eyes:     Extraocular Movements: Extraocular movements intact.     Conjunctiva/sclera: Conjunctivae normal.     Pupils: Pupils  are equal, round, and reactive to light.  Cardiovascular:     Rate and Rhythm: Normal rate and regular rhythm.     Pulses: Normal pulses.     Heart sounds: Normal heart sounds.  Pulmonary:     Effort: Pulmonary effort is normal.     Breath sounds: Normal breath sounds.  Abdominal:     General: Bowel sounds are normal. There is no distension.     Palpations: Abdomen is soft. There is no mass.     Tenderness: There is no abdominal tenderness. There is right CVA tenderness and left CVA tenderness. There is no guarding or rebound.     Hernia: No hernia is present.  Genitourinary:    Comments: Urine sample showing large blood with moderate protein and glucose  Musculoskeletal:        General: Normal range of motion.     Cervical back: Normal range of motion and neck supple.  Lymphadenopathy:     Cervical: No cervical adenopathy.  Skin:    General: Skin is warm and dry.     Capillary Refill: Capillary refill takes less than 2 seconds.  Neurological:     General: No focal deficit present.     Mental Status: She is alert and oriented to person, place, and time.  Psychiatric:        Mood and Affect: Mood normal.        Behavior: Behavior normal.        Thought Content: Thought content normal.        Judgment: Judgment normal.       Assessment & Plan     1. Urinary tract infection with hematuria, site unspecified Start  bactrim DS twice daily for 7 days. Send for culture and sensitivity and adjust antibiotics as indicated  - POCT URINALYSIS DIP (CLINITEK) - Urine Culture; Future - sulfamethoxazole-trimethoprim (BACTRIM DS) 800-160 MG tablet; Take 1 tablet by mouth 2 (two) times daily.  Dispense: 14 tablet; Refill: 0 - Urine Culture  2. Bladder pain May take pyridium 200 mg up to three times daily as needed for bladder pain and spasms  - phenazopyridine (PYRIDIUM) 200 MG tablet; Take 1 tablet (200 mg total) by mouth 3 (three) times daily as needed for pain.  Dispense: 10 tablet;  Refill: 0  3. Type 2 diabetes mellitus with hyperglycemia, without long-term current use of insulin (HCC) Increase ozempic to 1 mg weekly and take metformin XR 500 mg daily. Check HgbA1c  at next routine visit.  - metformin (FORTAMET) 500 MG (OSM) 24 hr tablet; Take 1 tablet (500 mg total) by mouth 2 (two) times daily.  Dispense: 180 tablet; Refill: 1 - Semaglutide, 1 MG/DOSE, (OZEMPIC, 1 MG/DOSE,) 4 MG/3ML SOPN; Inject 1 mg into the skin once a week.  Dispense: 12 mL; Refill: 1   Return for prn worsening or persistent symptoms.        Ronnell Freshwater, NP  [facility] at [facility] [phone] (phone) [phone] (fax)  [place]

## 2021-10-28 LAB — URINE CULTURE

## 2021-10-29 NOTE — Progress Notes (Signed)
Started on Bactrim DS twice daily for 7 days

## 2021-11-11 DIAGNOSIS — R3989 Other symptoms and signs involving the genitourinary system: Secondary | ICD-10-CM | POA: Insufficient documentation

## 2021-11-11 DIAGNOSIS — N390 Urinary tract infection, site not specified: Secondary | ICD-10-CM | POA: Insufficient documentation

## 2021-11-20 ENCOUNTER — Ambulatory Visit: Payer: 59 | Admitting: Neurology

## 2021-11-20 DIAGNOSIS — G43709 Chronic migraine without aura, not intractable, without status migrainosus: Secondary | ICD-10-CM

## 2021-11-20 MED ORDER — ONABOTULINUMTOXINA 100 UNITS IJ SOLR
155.0000 [IU] | Freq: Once | INTRAMUSCULAR | Status: AC
  Administered 2021-11-20: 155 [IU] via INTRAMUSCULAR

## 2021-11-20 NOTE — Progress Notes (Signed)
Botox- 100 units x 2 vials Lot: [id number] Expiration: 12/2023 NDC: 0131-4388-87  Bacteriostatic 0.9% Sodium Chloride- 45m total Lot: [id number] Expiration: 10/03/2022 NDC: [address]-60 Dx: GR56.153 S/P

## 2021-11-20 NOTE — Patient Instructions (Signed)
11/20/2021: stable  08/27/2021: Excellent response, > 80% mprovement in migraine freq and severity. She left [facility] after they changed to increase workload, with many and dr tower   Consent Form Botulism Toxin Injection For Chronic Migraine    Reviewed orally with patient, additionally signature is on file:  Botulism toxin has been approved by the Federal drug administration for treatment of chronic migraine. Botulism toxin does not cure chronic migraine and it may not be effective in some patients.  The administration of botulism toxin is accomplished by injecting a small amount of toxin into the muscles of the neck and head. Dosage must be titrated for each individual. Any benefits resulting from botulism toxin tend to wear off after 3 months with a repeat injection required if benefit is to be maintained. Injections are usually done every 3-4 months with maximum effect peak achieved by about 2 or 3 weeks. Botulism toxin is expensive and you should be sure of what costs you will incur resulting from the injection.  The side effects of botulism toxin use for chronic migraine may include:   -Transient, and usually mild, facial weakness with facial injections  -Transient, and usually mild, head or neck weakness with head/neck injections  -Reduction or loss of forehead facial animation due to forehead muscle weakness  -Eyelid drooping  -Dry eye  -Pain at the site of injection or bruising at the site of injection  -Double vision  -Potential unknown long term risks  Contraindications: You should not have Botox if you are pregnant, nursing, allergic to albumin, have an infection, skin condition, or muscle weakness at the site of the injection, or have myasthenia gravis, Lambert-Eaton syndrome, or ALS.  It is also possible that as with any injection, there may be an allergic reaction or no effect from the medication. Reduced effectiveness after repeated injections is sometimes seen  and rarely infection at the injection site may occur. All care will be taken to prevent these side effects. If therapy is given over a long time, atrophy and wasting in the muscle injected may occur. Occasionally the patient's become refractory to treatment because they develop antibodies to the toxin. In this event, therapy needs to be modified.  I have read the above information and consent to the administration of botulism toxin.    BOTOX PROCEDURE NOTE FOR MIGRAINE HEADACHE    Contraindications and precautions discussed with patient(above). Aseptic procedure was observed and patient tolerated procedure. Procedure performed by Dr. Georgia Dom  The condition has existed for more than 6 months, and pt does not have a diagnosis of ALS, Myasthenia Gravis or Lambert-Eaton Syndrome.  Risks and benefits of injections discussed and pt agrees to proceed with the procedure.  Written consent obtained  These injections are medically necessary. Pt  receives good benefits from these injections. These injections do not cause sedations or hallucinations which the oral therapies may cause.  Description of procedure:  The patient was placed in a sitting position. The standard protocol was used for Botox as follows, with 5 units of Botox injected at each site:   -Procerus muscle, midline injection  -Corrugator muscle, bilateral injection  -Frontalis muscle, bilateral injection, with 2 sites each side, medial injection was performed in the upper one third of the frontalis muscle, in the region vertical from the medial inferior edge of the superior orbital rim. The lateral injection was again in the upper one third of the forehead vertically above the lateral limbus of the cornea, 1.5 cm lateral  to the medial injection site.  -Temporalis muscle injection, 4 sites, bilaterally. The first injection was 3 cm above the tragus of the ear, second injection site was 1.5 cm to 3 cm up from the first injection site  in line with the tragus of the ear. The third injection site was 1.5-3 cm forward between the first 2 injection sites. The fourth injection site was 1.5 cm posterior to the second injection site.   -Occipitalis muscle injection, 3 sites, bilaterally. The first injection was done one half way between the occipital protuberance and the tip of the mastoid process behind the ear. The second injection site was done lateral and superior to the first, 1 fingerbreadth from the first injection. The third injection site was 1 fingerbreadth superiorly and medially from the first injection site.  -Cervical paraspinal muscle injection, 2 sites, bilateral knee first injection site was 1 cm from the midline of the cervical spine, 3 cm inferior to the lower border of the occipital protuberance. The second injection site was 1.5 cm superiorly and laterally to the first injection site.  -Trapezius muscle injection was performed at 3 sites, bilaterally. The first injection site was in the upper trapezius muscle halfway between the inflection point of the neck, and the acromion. The second injection site was one half way between the acromion and the first injection site. The third injection was done between the first injection site and the inflection point of the neck.   Will return for repeat injection in 3 months.   155 units of Botox was used, 45 Botox not injected was wasted. The patient tolerated the procedure well, there were no complications of the above procedure.

## 2021-12-10 ENCOUNTER — Encounter: Payer: Self-pay | Admitting: Neurology

## 2021-12-10 MED ORDER — PREDNISONE 20 MG PO TABS: 60.0000 mg | ORAL_TABLET | Freq: Every day | ORAL | 1 refills | Status: DC

## 2021-12-22 IMAGING — MR MR LUMBAR SPINE WO/W CM
4 of 7 series · 17 of 48 positions shown · IV contrast (MULTIHANCE)
Comparison: 10/15/2006

CLINICAL DATA: Low back pain radiating to the left leg. Left leg
numbness.

EXAM:
MRI LUMBAR SPINE WITHOUT AND WITH CONTRAST
TECHNIQUE: Multiplanar and multiecho pulse sequences of the lumbar spine were
obtained without and with intravenous contrast.
CONTRAST:  20mL MULTIHANCE GADOBENATE DIMEGLUMINE 529 MG/ML IV SOLN

[Series 10: T1 · sagittal · 4.0mm · 0.73mm/px · 3 of 17 slices shown (1 of 2)]
[im 1/17]
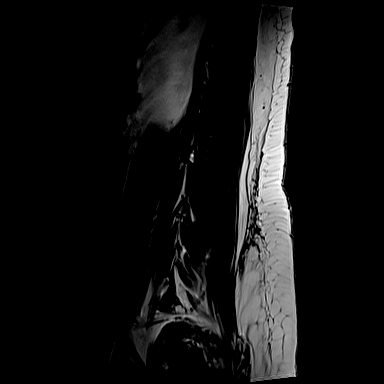
[im 11/17]
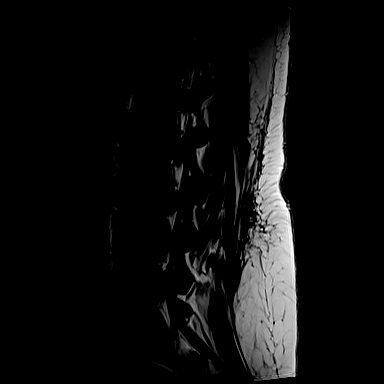
[im 17/17]
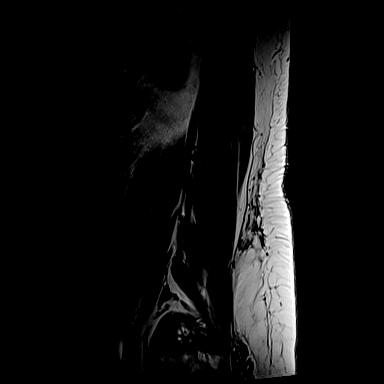

[Series 13: T1 · axial · 4.0mm · 0.28mm/px · z∈[-231,-44]mm · 3 of 45 slices shown (2 of 2)]
[im 5/45]
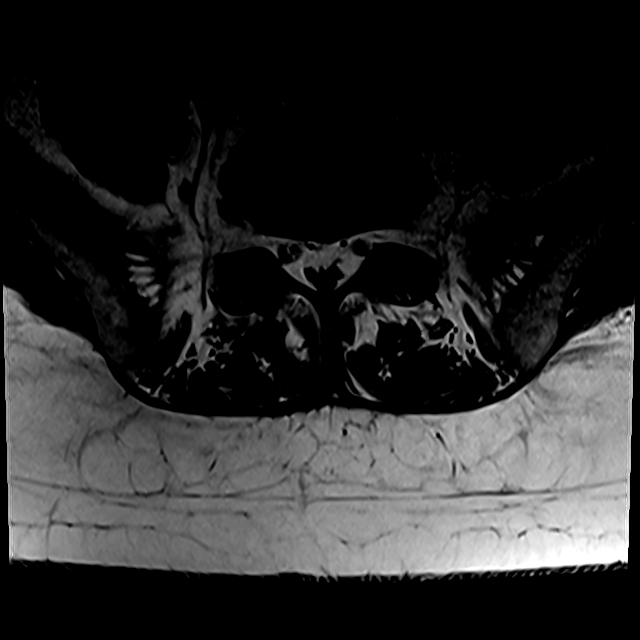
[im 23/45]
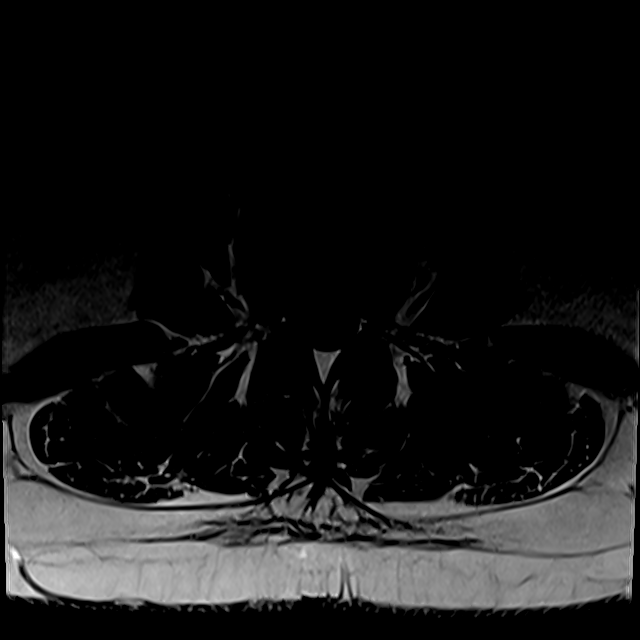
[im 40/45]
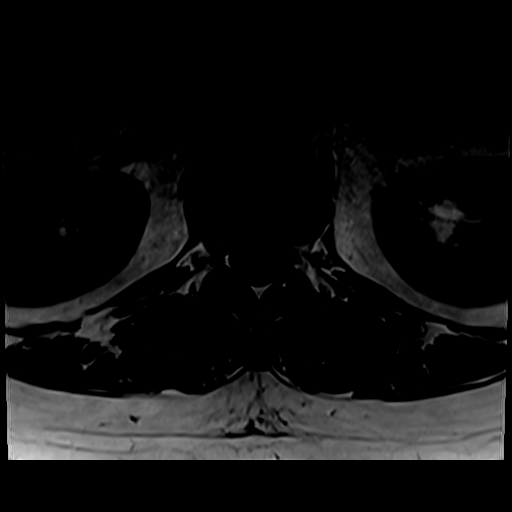

[Series 16: T2 · axial · 4.0mm · 0.28mm/px · z∈[-250,-44]mm · 8 of 45 slices shown (1 of 2)]
[im 1/45]
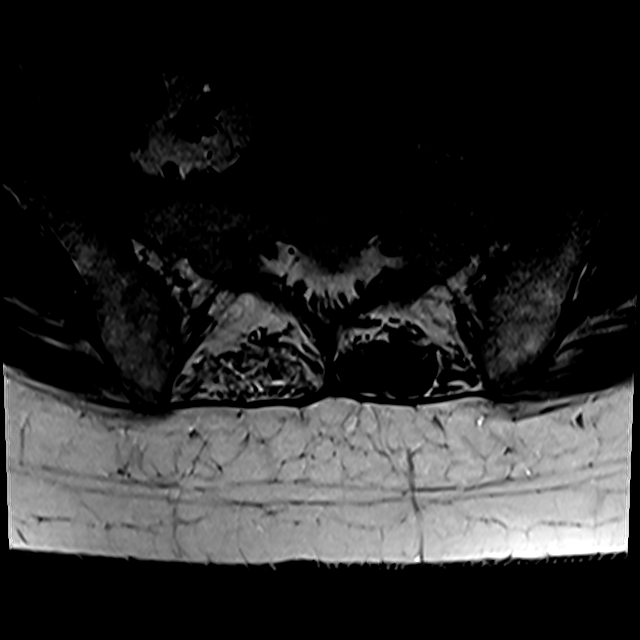
[im 5/45]
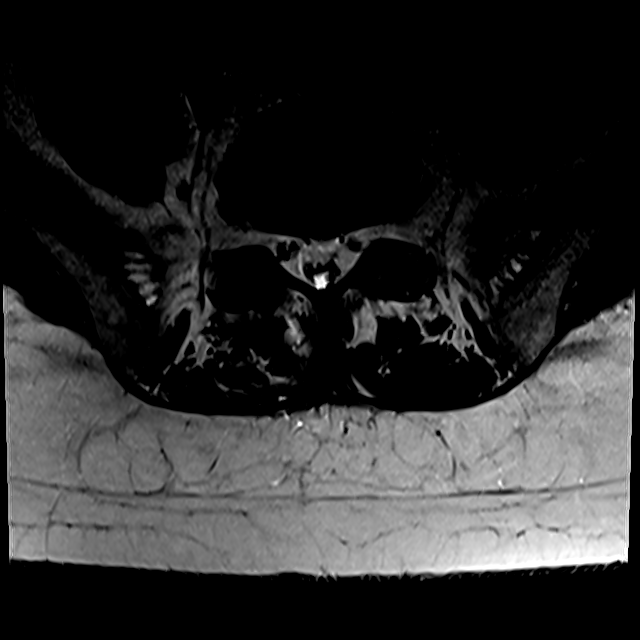
[im 9/45]
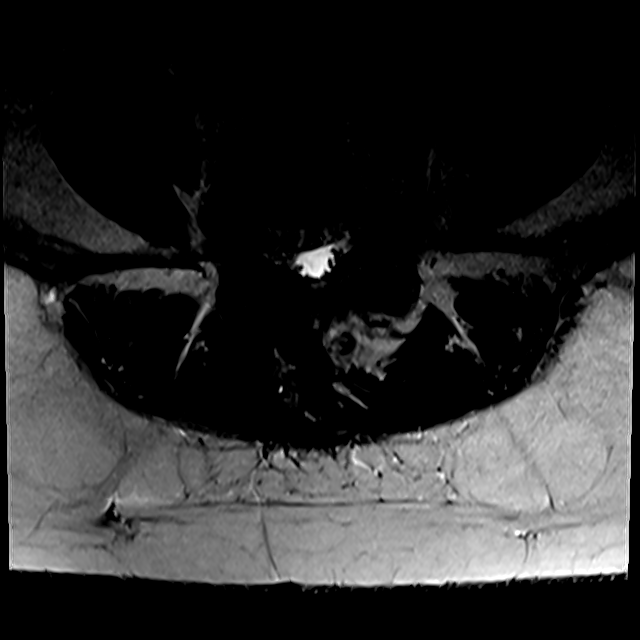
[im 14/45]
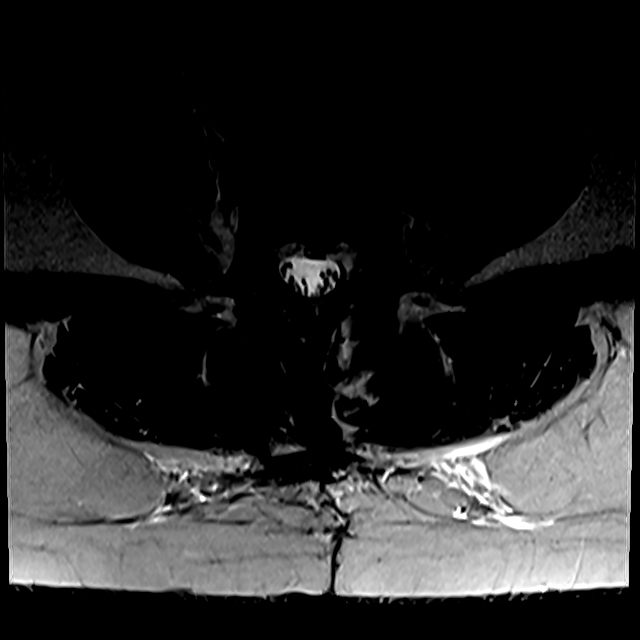
[im 18/45]
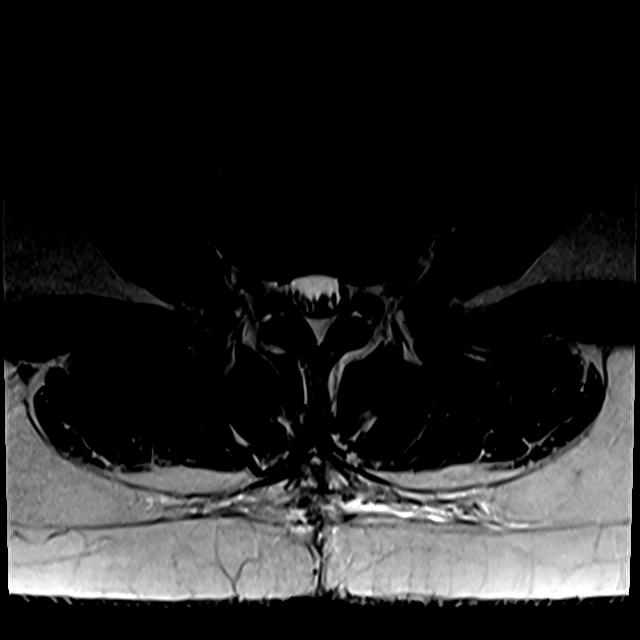
[im 23/45]
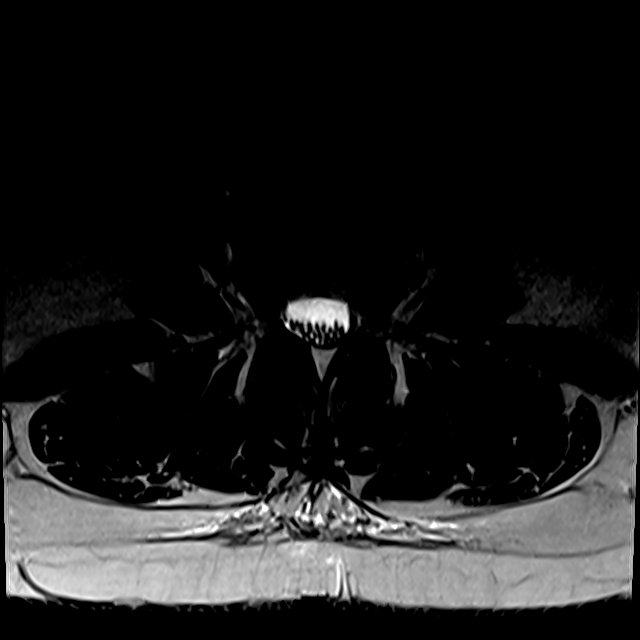
[im 27/45]
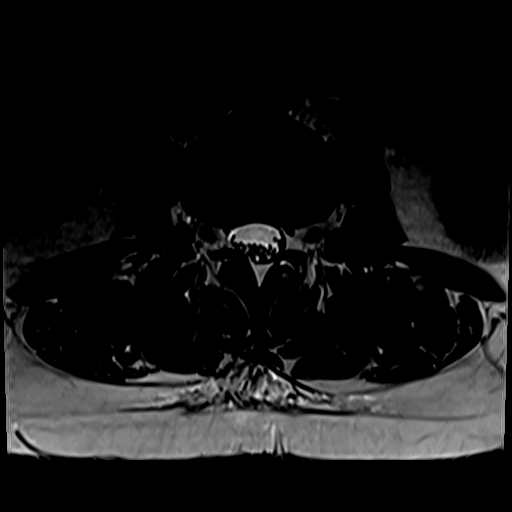
[im 40/45]
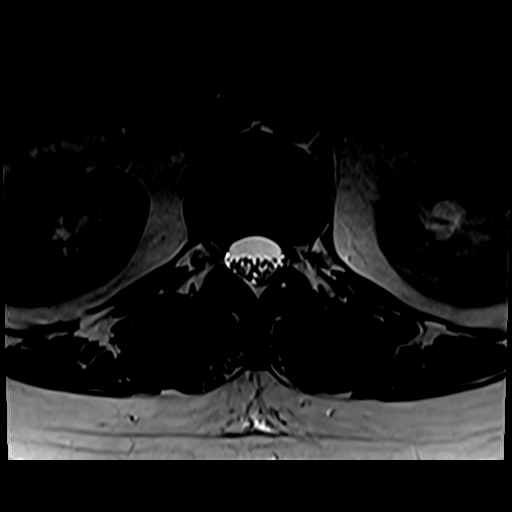

[Series 20: T2 · sagittal · 4.0mm · 0.73mm/px · 3 of 17 slices shown (2 of 2)]
[im 1/17]
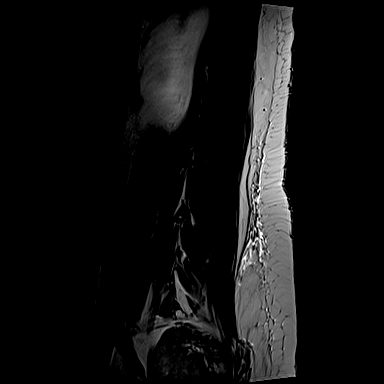
[im 11/17]
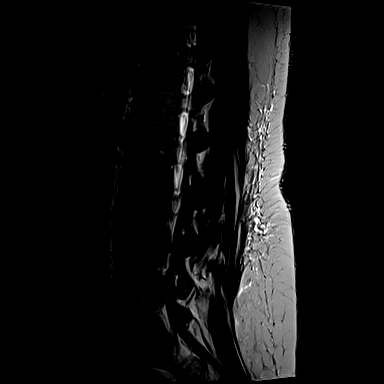
[im 17/17]
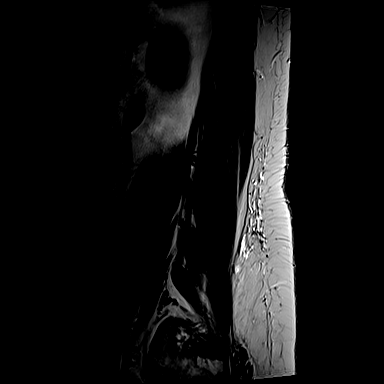

[17 of 48 positions shown; findings below may reference images not displayed]

FINDINGS: Segmentation:  Standard.

Alignment:  Physiologic.

Vertebrae: No acute fracture, evidence of discitis, or aggressive
bone lesion.

Conus medullaris and cauda equina: Conus extends to the T12 level.
Conus and cauda equina appear normal.

Paraspinal and other soft tissues: Postsurgical changes in the
posterior paraspinal soft tissues at L4-5.

Disc levels:

Disc spaces: Disc desiccation at L3-4 and L4-5.

T12-L1: No significant disc bulge. No neural foraminal stenosis. No
central canal stenosis.

L1-L2: No significant disc bulge. No neural foraminal stenosis. No
central canal stenosis.

L2-L3: No significant disc bulge. No neural foraminal stenosis. No
central canal stenosis.

L3-L4: Mild broad-based disc bulge with a left foraminal broad disc
protrusion. No foraminal or central canal stenosis.

L4-L5: Right laminotomy defect. Broad-based disc bulge. Postsurgical
changes from microdiscectomy along the right paracentral aspect.
Mild epidural fibrosis along the right lateral aspect of the thecal
sac. No foraminal or central canal stenosis. No recurrent disc
protrusion. Mild bilateral facet arthropathy.

L5-S1: No significant disc bulge. No neural foraminal stenosis. No
central canal stenosis.
IMPRESSION: 1. At L3-4 there is a mild broad-based disc bulge with a left
foraminal broad disc protrusion. No foraminal or central canal
stenosis.
2. At L4-5 there is a right laminotomy defect. Broad-based disc
bulge with postsurgical changes from microdiscectomy along the right
paracentral aspect. Mild epidural fibrosis along the right lateral
aspect of the thecal sac. No foraminal or central canal stenosis. No
recurrent disc protrusion. Mild bilateral facet arthropathy.

## 2021-12-25 ENCOUNTER — Encounter (HOSPITAL_BASED_OUTPATIENT_CLINIC_OR_DEPARTMENT_OTHER): Payer: Self-pay | Admitting: Obstetrics and Gynecology

## 2021-12-25 DIAGNOSIS — D219 Benign neoplasm of connective and other soft tissue, unspecified: Secondary | ICD-10-CM | POA: Diagnosis not present

## 2021-12-25 DIAGNOSIS — Z01818 Encounter for other preprocedural examination: Secondary | ICD-10-CM | POA: Diagnosis present

## 2021-12-27 ENCOUNTER — Encounter (HOSPITAL_BASED_OUTPATIENT_CLINIC_OR_DEPARTMENT_OTHER): Payer: Self-pay | Admitting: Obstetrics and Gynecology

## 2021-12-27 ENCOUNTER — Other Ambulatory Visit: Payer: Self-pay

## 2021-12-27 NOTE — Progress Notes (Signed)
Your procedure is scheduled on Tuesday, 01/01/22.  Report to [facility].   Call this number if you have problems the morning of surgery  :[phone].   OUR ADDRESS IS [address].  WE ARE LOCATED IN [facility].  PLEASE BRING YOUR INSURANCE CARD AND PHOTO ID DAY OF SURGERY.  ONLY 2 PEOPLE ARE ALLOWED IN  WAITING  ROOM.                                      REMEMBER:  DO NOT EAT FOOD, CANDY GUM OR MINTS  AFTER MIDNIGHT THE NIGHT BEFORE YOUR SURGERY . YOU MAY HAVE CLEAR LIQUIDS FROM MIDNIGHT THE NIGHT BEFORE YOUR SURGERY UNTIL  4:30 AM. NO CLEAR LIQUIDS AFTER   4:30 AM DAY OF SURGERY.  YOU MAY  BRUSH YOUR TEETH MORNING OF SURGERY AND RINSE YOUR MOUTH OUT, NO CHEWING GUM CANDY OR MINTS.     CLEAR LIQUID DIET   Foods Allowed                                                                     Foods Excluded  Coffee and tea, regular and decaf                             liquids that you cannot  Plain Jell-O                                                                   see through such as: Fruit ices (not with fruit pulp)                                     milk, soups, orange juice  Plain  Popsicles                                    All solid food Carbonated beverages, regular and diet                                    Cranberry, grape and apple juices Sports drinks like Gatorade _____________________________________________________________________     TAKE ONLY THESE MEDICATIONS MORNING OF SURGERY: NONE Hold Ozempic until next dose due on 01/03/22. Hold Metformin on morning of surgery.   UP TO 4 VISITORS  MAY VISIT IN THE EXTENDED RECOVERY ROOM UNTIL 800 PM ONLY.  ONE  VISITOR AGE 43 AND OVER MAY SPEND THE NIGHT AND MUST BE IN EXTENDED RECOVERY ROOM NO LATER THAN 800 PM . YOUR DISCHARGE TIME AFTER YOU SPEND THE NIGHT IS 900 AM THE MORNING AFTER YOUR SURGERY.  YOU MAY PACK A SMALL OVERNIGHT BAG WITH TOILETRIES FOR  YOUR OVERNIGHT STAY IF YOU WISH.  YOUR PRESCRIPTION MEDICATIONS WILL BE PROVIDED DURING [place].                                      DO NOT WEAR JEWERLY, MAKE UP. DO NOT WEAR LOTIONS, POWDERS, PERFUMES OR NAIL POLISH ON YOUR FINGERNAILS. TOENAIL POLISH IS OK TO WEAR. DO NOT SHAVE FOR 48 HOURS PRIOR TO DAY OF SURGERY. MEN MAY SHAVE FACE AND NECK. CONTACTS, GLASSES, OR DENTURES MAY NOT BE WORN TO SURGERY.  REMEMBER: NO SMOKING, DRUGS OR ALCOHOL FOR 24 HOURS BEFORE YOUR SURGERY.                                    [facility] IS NOT RESPONSIBLE  FOR ANY BELONGINGS.                                                                    Marland Kitchen           [facility] - Preparing for Surgery Before surgery, you can play an important role.  Because skin is not sterile, your skin needs to be as free of germs as possible.  You can reduce the number of germs on your skin by washing with CHG (chlorahexidine gluconate) soap before surgery.  CHG is an antiseptic cleaner which kills germs and bonds with the skin to continue killing germs even after washing. Please DO NOT use if you have an allergy to CHG or antibacterial soaps.  If your skin becomes reddened/irritated stop using the CHG and inform your nurse when you arrive at Short Stay. Do not shave (including legs and underarms) for at least 48 hours prior to the first CHG shower.  You may shave your face/neck. Please follow these instructions carefully:  1.  Shower with CHG Soap the night before surgery and the  morning of Surgery.  2.  If you choose to wash your hair, wash your hair first as usual with your  normal  shampoo.  3.  After you shampoo, rinse your hair and body thoroughly to remove the  shampoo.                                        4.  Use CHG as you would any other liquid soap.  You can apply chg directly  to the skin and wash , chg soap provided, night before and morning of your surgery.  5.  Apply the CHG Soap to your body ONLY FROM  THE NECK DOWN.   Do not use on face/ open                           Wound or open sores. Avoid contact with eyes, ears mouth and genitals (private parts).  Wash face,  Genitals (private parts) with your normal soap.             6.  Wash thoroughly, paying special attention to the area where your surgery  will be performed.  7.  Thoroughly rinse your body with warm water from the neck down.  8.  DO NOT shower/wash with your normal soap after using and rinsing off  the CHG Soap.             9.  Pat yourself dry with a clean towel.            10.  Wear clean pajamas.            11.  Place clean sheets on your bed the night of your first shower and do not  sleep with pets. Day of Surgery : Do not apply any lotions/deodorants the morning of surgery.  Please wear clean clothes to the hospital/surgery center.  IF YOU HAVE ANY SKIN IRRITATION OR PROBLEMS WITH THE SURGICAL SOAP, PLEASE GET A BAR OF GOLD DIAL SOAP AND SHOWER THE NIGHT BEFORE YOUR SURGERY AND THE MORNING OF YOUR SURGERY. PLEASE LET THE NURSE KNOW MORNING OF YOUR SURGERY IF YOU HAD ANY PROBLEMS WITH THE SURGICAL SOAP.   ________________________________________________________________________                                                        QUESTIONS [facility] PRE OP NURSE PHONE [phone].

## 2021-12-27 NOTE — Progress Notes (Addendum)
Spoke w/ via phone for pre-op interview---Candice [name] needs dos----urine pregnancy               [name] results------12/28/21 [name] appt for cbc, type & screen, bmp, ekg, 01/05/21 Chest xray in Epic, 08/19/21 HgbA1c 9.2 in Epic, 06/02/20 Coronary Calcium =0 in Epic COVID test -----patient states asymptomatic no test needed Arrive at -------0530 on Tuesday, 01/01/22 NPO after MN NO Solid Food.  Clear liquids from MN until---0430 Med rec completed Medications to take morning of surgery -----None Diabetic medication -----Hold Metformn morning of surgery. Hold Ozempic 7 days prior to surgery. Last dose on 12/20/21. Resume on 01/03/22. Patient instructed no nail polish to be worn day of surgery Patient instructed to bring photo id and insurance card day of surgery Patient aware to have Driver (ride ) / caregiver    for 24 hours after surgery - husband, [facility] Patient Special Instructions -----Extended / Overnight stay instructions given. Pre-Op special Istructions -----none Patient verbalized understanding of instructions that were given at this phone interview. Patient denies shortness of breath, chest pain, fever, cough at this phone interview.

## 2021-12-28 ENCOUNTER — Encounter (HOSPITAL_COMMUNITY)
Admission: RE | Admit: 2021-12-28 | Discharge: 2021-12-28 | Disposition: A | Discharge status: Home / Self Care | Payer: 59 | Source: Ambulatory Visit | Attending: Obstetrics and Gynecology | Admitting: Obstetrics and Gynecology

## 2021-12-28 ENCOUNTER — Other Ambulatory Visit: Payer: Self-pay | Admitting: Nurse Practitioner

## 2021-12-28 DIAGNOSIS — Z01818 Encounter for other preprocedural examination: Secondary | ICD-10-CM | POA: Diagnosis not present

## 2021-12-28 DIAGNOSIS — E1165 Type 2 diabetes mellitus with hyperglycemia: Secondary | ICD-10-CM

## 2021-12-28 DIAGNOSIS — D219 Benign neoplasm of connective and other soft tissue, unspecified: Secondary | ICD-10-CM | POA: Diagnosis not present

## 2021-12-28 LAB — BASIC METABOLIC PANEL
Anion gap: 9 (ref 5–15)
BUN: 6 mg/dL (ref 6–20)
CO2: 25 mmol/L (ref 22–32)
Calcium: 9.4 mg/dL (ref 8.9–10.3)
Chloride: 103 mmol/L (ref 98–111)
Creatinine, Ser: 0.5 mg/dL (ref 0.44–1.00)
GFR, Estimated: >60 mL/min (ref >60)
Glucose, Bld: 145 mg/dL — ABNORMAL HIGH (ref 70–99)
Potassium: 3.8 mmol/L (ref 3.5–5.1)
Sodium: 137 mmol/L (ref 135–145)

## 2021-12-28 LAB — CBC
HCT: 36.4 % (ref 36.0–46.0)
Hemoglobin: 10.6 g/dL — ABNORMAL LOW (ref 12.0–15.0)
MCH: 22.1 pg — ABNORMAL LOW (ref 26.0–34.0)
MCHC: 29.1 g/dL — ABNORMAL LOW (ref 30.0–36.0)
MCV: 75.8 fL — ABNORMAL LOW (ref 80.0–100.0)
Platelets: 348 10*3/uL (ref 150–400)
RBC: 4.8 MIL/uL (ref 3.87–5.11)
RDW: 15.6 % — ABNORMAL HIGH (ref 11.5–15.5)
WBC: 7.4 10*3/uL (ref 4.0–10.5)
nRBC: 0 % (ref 0.0–0.2)

## 2021-12-31 NOTE — Anesthesia Preprocedure Evaluation (Addendum)
Anesthesia Evaluation  Patient identified by MRN, date of birth, ID band Patient awake    Reviewed: Allergy & Precautions, NPO status , Patient's Chart, lab work & pertinent test results  History of Anesthesia Complications (+) PONV and history of anesthetic complications  Airway Mallampati: II  TM Distance: >3 FB Neck ROM: Full    Dental  (+) Dental Advisory Given   Pulmonary neg pulmonary ROS,    Pulmonary exam normal        Cardiovascular negative cardio ROS Normal cardiovascular exam     Neuro/Psych  Headaches, PSYCHIATRIC DISORDERS Anxiety    GI/Hepatic Neg liver ROS, GERD  Controlled,  Endo/Other  diabetes, Type 2, Oral Hypoglycemic Agents Obesity   Renal/GU negative Renal ROS     Musculoskeletal  Gout    Abdominal   Peds  Hematology  (+) Blood dyscrasia, anemia ,   Anesthesia Other Findings On GLP-1 agonist, last dose 14 days ago  Reproductive/Obstetrics  Fibroids                            Anesthesia Physical Anesthesia Plan  ASA: 2  Anesthesia Plan: General   Post-op Pain Management: Tylenol PO (pre-op)* and Toradol IV (intra-op)*   Induction: Intravenous  PONV Risk Score and Plan: 4 or greater and Treatment may vary due to age or medical condition, Ondansetron, Dexamethasone, Midazolam and Scopolamine patch - Pre-op  Airway Management Planned: Oral ETT  Additional Equipment: None  Intra-op Plan:   Post-operative Plan: Extubation in OR  Informed Consent: I have reviewed the patients History and Physical, chart, labs and discussed the procedure including the risks, benefits and alternatives for the proposed anesthesia with the patient or authorized representative who has indicated his/her understanding and acceptance.     Dental advisory given  Plan Discussed with: CRNA and Anesthesiologist  Anesthesia Plan Comments:        Anesthesia Quick  Evaluation

## 2022-01-01 ENCOUNTER — Encounter (HOSPITAL_BASED_OUTPATIENT_CLINIC_OR_DEPARTMENT_OTHER): Payer: Self-pay | Admitting: Obstetrics and Gynecology

## 2022-01-01 ENCOUNTER — Other Ambulatory Visit: Payer: Self-pay | Admitting: Neurology

## 2022-01-01 ENCOUNTER — Ambulatory Visit (HOSPITAL_BASED_OUTPATIENT_CLINIC_OR_DEPARTMENT_OTHER): Payer: 59 | Admitting: Anesthesiology

## 2022-01-01 ENCOUNTER — Encounter (HOSPITAL_BASED_OUTPATIENT_CLINIC_OR_DEPARTMENT_OTHER)
Admission: RE | Disposition: A | Discharge status: Home / Self Care | Payer: Self-pay | Source: Home / Self Care | Attending: Obstetrics and Gynecology

## 2022-01-01 ENCOUNTER — Other Ambulatory Visit: Payer: Self-pay

## 2022-01-01 ENCOUNTER — Ambulatory Visit (HOSPITAL_BASED_OUTPATIENT_CLINIC_OR_DEPARTMENT_OTHER)
Admission: RE | Admit: 2022-01-01 | Discharge: 2022-01-02 | Disposition: A | Discharge status: Home / Self Care | Payer: 59 | Attending: Obstetrics and Gynecology | Admitting: Obstetrics and Gynecology

## 2022-01-01 DIAGNOSIS — N809 Endometriosis, unspecified: Secondary | ICD-10-CM | POA: Diagnosis not present

## 2022-01-01 DIAGNOSIS — D219 Benign neoplasm of connective and other soft tissue, unspecified: Secondary | ICD-10-CM | POA: Diagnosis present

## 2022-01-01 DIAGNOSIS — Z7985 Long-term (current) use of injectable non-insulin antidiabetic drugs: Secondary | ICD-10-CM | POA: Insufficient documentation

## 2022-01-01 DIAGNOSIS — N870 Mild cervical dysplasia: Secondary | ICD-10-CM | POA: Insufficient documentation

## 2022-01-01 DIAGNOSIS — D259 Leiomyoma of uterus, unspecified: Secondary | ICD-10-CM | POA: Insufficient documentation

## 2022-01-01 DIAGNOSIS — F419 Anxiety disorder, unspecified: Secondary | ICD-10-CM | POA: Diagnosis not present

## 2022-01-01 DIAGNOSIS — E669 Obesity, unspecified: Secondary | ICD-10-CM | POA: Insufficient documentation

## 2022-01-01 DIAGNOSIS — Z7984 Long term (current) use of oral hypoglycemic drugs: Secondary | ICD-10-CM | POA: Diagnosis not present

## 2022-01-01 DIAGNOSIS — R102 Pelvic and perineal pain: Secondary | ICD-10-CM | POA: Insufficient documentation

## 2022-01-01 DIAGNOSIS — N80202 Endometriosis of left fallopian tube, unspecified depth: Secondary | ICD-10-CM | POA: Diagnosis not present

## 2022-01-01 DIAGNOSIS — N80102 Endometriosis of left ovary, unspecified depth: Secondary | ICD-10-CM | POA: Insufficient documentation

## 2022-01-01 DIAGNOSIS — N72 Inflammatory disease of cervix uteri: Secondary | ICD-10-CM | POA: Diagnosis not present

## 2022-01-01 DIAGNOSIS — N920 Excessive and frequent menstruation with regular cycle: Secondary | ICD-10-CM | POA: Insufficient documentation

## 2022-01-01 DIAGNOSIS — Z90710 Acquired absence of both cervix and uterus: Secondary | ICD-10-CM | POA: Diagnosis present

## 2022-01-01 DIAGNOSIS — N888 Other specified noninflammatory disorders of cervix uteri: Secondary | ICD-10-CM | POA: Insufficient documentation

## 2022-01-01 DIAGNOSIS — D649 Anemia, unspecified: Secondary | ICD-10-CM | POA: Diagnosis not present

## 2022-01-01 DIAGNOSIS — E119 Type 2 diabetes mellitus without complications: Secondary | ICD-10-CM | POA: Insufficient documentation

## 2022-01-01 DIAGNOSIS — Z6835 Body mass index (BMI) 35.0-35.9, adult: Secondary | ICD-10-CM | POA: Insufficient documentation

## 2022-01-01 DIAGNOSIS — N8312 Corpus luteum cyst of left ovary: Secondary | ICD-10-CM | POA: Diagnosis not present

## 2022-01-01 DIAGNOSIS — Z01818 Encounter for other preprocedural examination: Secondary | ICD-10-CM

## 2022-01-01 HISTORY — DX: Presence of spectacles and contact lenses: Z97.3

## 2022-01-01 HISTORY — DX: Type 2 diabetes mellitus without complications: E11.9

## 2022-01-01 HISTORY — DX: Other specified postprocedural states: Z98.890

## 2022-01-01 HISTORY — PX: HYSTERECTOMY ABDOMINAL WITH SALPINGO-OOPHORECTOMY: SHX6792

## 2022-01-01 HISTORY — DX: Other specified postprocedural states: R11.2

## 2022-01-01 HISTORY — DX: Nausea with vomiting, unspecified: R11.2

## 2022-01-01 LAB — POCT PREGNANCY, URINE: Preg Test, Ur: NEGATIVE

## 2022-01-01 LAB — GLUCOSE, CAPILLARY
Glucose-Capillary: 192 mg/dL — ABNORMAL HIGH (ref 70–99)
Glucose-Capillary: 253 mg/dL — ABNORMAL HIGH (ref 70–99)
Glucose-Capillary: 215 mg/dL — ABNORMAL HIGH (ref 70–99)
Glucose-Capillary: 162 mg/dL — ABNORMAL HIGH (ref 70–99)
Glucose-Capillary: 105 mg/dL — ABNORMAL HIGH (ref 70–99)

## 2022-01-01 LAB — ABO/RH: ABO/RH(D): A POS

## 2022-01-01 LAB — TYPE AND SCREEN
ABO/RH(D): A POS
Antibody Screen: NEGATIVE

## 2022-01-01 SURGERY — HYSTERECTOMY, ABDOMINAL, WITH SALPINGO-OOPHORECTOMY
Anesthesia: General | Site: Abdomen | Laterality: Bilateral

## 2022-01-01 MED ORDER — LIDOCAINE HCL (PF) 2 % IJ SOLN
INTRAMUSCULAR | Status: AC
  Filled 2022-01-01: qty 5

## 2022-01-01 MED ORDER — OXYCODONE HCL 5 MG PO TABS
ORAL_TABLET | ORAL | Status: AC
  Filled 2022-01-01: qty 2

## 2022-01-01 MED ORDER — FENTANYL CITRATE (PF) 250 MCG/5ML IJ SOLN
INTRAMUSCULAR | Status: AC
  Filled 2022-01-01: qty 5

## 2022-01-01 MED ORDER — IBUPROFEN 200 MG PO TABS
600.0000 mg | ORAL_TABLET | Freq: Four times a day (QID) | ORAL | Status: DC
  Administered 2022-01-01 – 2022-01-02 (×4): 600 mg via ORAL

## 2022-01-01 MED ORDER — LIDOCAINE 2% (20 MG/ML) 5 ML SYRINGE
INTRAMUSCULAR | Status: DC | PRN
  Administered 2022-01-01: 60 mg via INTRAVENOUS

## 2022-01-01 MED ORDER — BUPIVACAINE HCL (PF) 0.25 % IJ SOLN
INTRAMUSCULAR | Status: DC | PRN
  Administered 2022-01-01: 10 mL

## 2022-01-01 MED ORDER — SCOPOLAMINE 1 MG/3DAYS TD PT72
1.0000 | MEDICATED_PATCH | TRANSDERMAL | Status: DC
  Administered 2022-01-01: 1.5 mg via TRANSDERMAL

## 2022-01-01 MED ORDER — ONDANSETRON HCL 4 MG PO TABS: 4.0000 mg | ORAL_TABLET | Freq: Four times a day (QID) | ORAL | Status: DC | PRN

## 2022-01-01 MED ORDER — PROMETHAZINE HCL 25 MG/ML IJ SOLN: 6.2500 mg | INTRAMUSCULAR | Status: DC | PRN

## 2022-01-01 MED ORDER — FENTANYL CITRATE (PF) 100 MCG/2ML IJ SOLN
25.0000 ug | INTRAMUSCULAR | Status: DC | PRN
  Administered 2022-01-01 (×4): 25 ug via INTRAVENOUS

## 2022-01-01 MED ORDER — OXYCODONE HCL 5 MG PO TABS
5.0000 mg | ORAL_TABLET | ORAL | Status: DC | PRN
  Administered 2022-01-01: 10 mg via ORAL
  Administered 2022-01-01: 5 mg via ORAL
  Administered 2022-01-01 – 2022-01-02 (×3): 10 mg via ORAL

## 2022-01-01 MED ORDER — DEXMEDETOMIDINE HCL IN NACL 200 MCG/50ML IV SOLN
INTRAVENOUS | Status: DC | PRN
  Administered 2022-01-01: 12 ug via INTRAVENOUS
  Administered 2022-01-01: 8 ug via INTRAVENOUS

## 2022-01-01 MED ORDER — ACETAMINOPHEN 500 MG PO TABS
ORAL_TABLET | ORAL | Status: AC
  Filled 2022-01-01: qty 2

## 2022-01-01 MED ORDER — DEXAMETHASONE SODIUM PHOSPHATE 10 MG/ML IJ SOLN
INTRAMUSCULAR | Status: AC
  Filled 2022-01-01: qty 1

## 2022-01-01 MED ORDER — DEXMEDETOMIDINE HCL IN NACL 80 MCG/20ML IV SOLN
INTRAVENOUS | Status: AC
  Filled 2022-01-01: qty 20

## 2022-01-01 MED ORDER — LACTATED RINGERS IV SOLN: INTRAVENOUS | Status: DC

## 2022-01-01 MED ORDER — ACETAMINOPHEN 500 MG PO TABS
1000.0000 mg | ORAL_TABLET | Freq: Once | ORAL | Status: AC
  Administered 2022-01-01: 1000 mg via ORAL

## 2022-01-01 MED ORDER — FENTANYL CITRATE (PF) 100 MCG/2ML IJ SOLN
INTRAMUSCULAR | Status: DC | PRN
  Administered 2022-01-01 (×4): 50 ug via INTRAVENOUS

## 2022-01-01 MED ORDER — INSULIN ASPART 100 UNIT/ML IJ SOLN
5.0000 [IU] | Freq: Once | INTRAMUSCULAR | Status: AC
  Administered 2022-01-01: 5 [IU] via SUBCUTANEOUS

## 2022-01-01 MED ORDER — INSULIN ASPART 100 UNIT/ML IJ SOLN
INTRAMUSCULAR | Status: AC
  Filled 2022-01-01: qty 1

## 2022-01-01 MED ORDER — HYDROMORPHONE HCL 1 MG/ML IJ SOLN
INTRAMUSCULAR | Status: AC
  Filled 2022-01-01: qty 1

## 2022-01-01 MED ORDER — ARTIFICIAL TEARS OPHTHALMIC OINT
TOPICAL_OINTMENT | OPHTHALMIC | Status: AC
  Filled 2022-01-01: qty 3.5

## 2022-01-01 MED ORDER — MENTHOL 3 MG MT LOZG: 1.0000 | LOZENGE | OROMUCOSAL | Status: DC | PRN

## 2022-01-01 MED ORDER — PROPOFOL 10 MG/ML IV BOLUS
INTRAVENOUS | Status: DC | PRN
  Administered 2022-01-01: 200 mg via INTRAVENOUS

## 2022-01-01 MED ORDER — OXYCODONE HCL 5 MG PO TABS
5.0000 mg | ORAL_TABLET | Freq: Once | ORAL | Status: AC | PRN
  Administered 2022-01-01: 5 mg via ORAL

## 2022-01-01 MED ORDER — KETOROLAC TROMETHAMINE 30 MG/ML IJ SOLN
INTRAMUSCULAR | Status: DC | PRN
  Administered 2022-01-01: 30 mg via INTRAVENOUS

## 2022-01-01 MED ORDER — ONDANSETRON HCL 4 MG/2ML IJ SOLN: 4.0000 mg | Freq: Four times a day (QID) | INTRAMUSCULAR | Status: DC | PRN

## 2022-01-01 MED ORDER — IBUPROFEN 200 MG PO TABS
ORAL_TABLET | ORAL | Status: AC
  Filled 2022-01-01: qty 3

## 2022-01-01 MED ORDER — OXYCODONE HCL 5 MG PO TABS
ORAL_TABLET | ORAL | Status: AC
  Filled 2022-01-01: qty 1

## 2022-01-01 MED ORDER — SCOPOLAMINE 1 MG/3DAYS TD PT72
MEDICATED_PATCH | TRANSDERMAL | Status: AC
  Filled 2022-01-01: qty 1

## 2022-01-01 MED ORDER — PROPOFOL 10 MG/ML IV BOLUS
INTRAVENOUS | Status: AC
  Filled 2022-01-01: qty 20

## 2022-01-01 MED ORDER — MIDAZOLAM HCL 5 MG/5ML IJ SOLN
INTRAMUSCULAR | Status: DC | PRN
  Administered 2022-01-01: 2 mg via INTRAVENOUS

## 2022-01-01 MED ORDER — HYDROMORPHONE HCL 1 MG/ML IJ SOLN
0.2000 mg | INTRAMUSCULAR | Status: DC | PRN
  Administered 2022-01-01 (×4): 0.6 mg via INTRAVENOUS

## 2022-01-01 MED ORDER — DEXAMETHASONE SODIUM PHOSPHATE 10 MG/ML IJ SOLN
INTRAMUSCULAR | Status: DC | PRN
  Administered 2022-01-01: 5 mg via INTRAVENOUS

## 2022-01-01 MED ORDER — ONDANSETRON HCL 4 MG/2ML IJ SOLN
INTRAMUSCULAR | Status: DC | PRN
  Administered 2022-01-01: 4 mg via INTRAVENOUS

## 2022-01-01 MED ORDER — SODIUM CHLORIDE 0.9 % IV SOLN
500.0000 mg | INTRAVENOUS | Status: AC
  Administered 2022-01-01: 500 mg via INTRAVENOUS
  Filled 2022-01-01: qty 5

## 2022-01-01 MED ORDER — OXYCODONE HCL 5 MG/5ML PO SOLN: 5.0000 mg | Freq: Once | ORAL | Status: AC | PRN

## 2022-01-01 MED ORDER — ONDANSETRON HCL 4 MG/2ML IJ SOLN
INTRAMUSCULAR | Status: AC
  Filled 2022-01-01: qty 2

## 2022-01-01 MED ORDER — ROCURONIUM BROMIDE 10 MG/ML (PF) SYRINGE
PREFILLED_SYRINGE | INTRAVENOUS | Status: AC
  Filled 2022-01-01: qty 10

## 2022-01-01 MED ORDER — ACETAMINOPHEN 325 MG PO TABS
650.0000 mg | ORAL_TABLET | ORAL | Status: DC | PRN
  Administered 2022-01-01 – 2022-01-02 (×2): 650 mg via ORAL

## 2022-01-01 MED ORDER — SIMETHICONE 80 MG PO CHEW: 80.0000 mg | CHEWABLE_TABLET | Freq: Four times a day (QID) | ORAL | Status: DC | PRN

## 2022-01-01 MED ORDER — AMISULPRIDE (ANTIEMETIC) 5 MG/2ML IV SOLN
INTRAVENOUS | Status: AC
  Filled 2022-01-01: qty 4

## 2022-01-01 MED ORDER — TEMAZEPAM 15 MG PO CAPS: 15.0000 mg | ORAL_CAPSULE | Freq: Every evening | ORAL | Status: DC | PRN

## 2022-01-01 MED ORDER — INSULIN ASPART 100 UNIT/ML IJ SOLN: 5.0000 [IU] | Freq: Once | INTRAMUSCULAR | Status: DC

## 2022-01-01 MED ORDER — ROCURONIUM BROMIDE 10 MG/ML (PF) SYRINGE
PREFILLED_SYRINGE | INTRAVENOUS | Status: DC | PRN
  Administered 2022-01-01: 60 mg via INTRAVENOUS
  Administered 2022-01-01: 10 mg via INTRAVENOUS

## 2022-01-01 MED ORDER — SUGAMMADEX SODIUM 200 MG/2ML IV SOLN
INTRAVENOUS | Status: DC | PRN
  Administered 2022-01-01: 100 mg via INTRAVENOUS

## 2022-01-01 MED ORDER — AMISULPRIDE (ANTIEMETIC) 5 MG/2ML IV SOLN
10.0000 mg | Freq: Once | INTRAVENOUS | Status: AC
  Administered 2022-01-01: 10 mg via INTRAVENOUS

## 2022-01-01 MED ORDER — SOD CITRATE-CITRIC ACID 500-334 MG/5ML PO SOLN: 30.0000 mL | ORAL | Status: DC

## 2022-01-01 MED ORDER — ACETAMINOPHEN 325 MG PO TABS
ORAL_TABLET | ORAL | Status: AC
  Filled 2022-01-01: qty 2

## 2022-01-01 MED ORDER — FENTANYL CITRATE (PF) 100 MCG/2ML IJ SOLN
INTRAMUSCULAR | Status: AC
  Filled 2022-01-01: qty 2

## 2022-01-01 MED ORDER — INSULIN ASPART 100 UNIT/ML IJ SOLN
0.0000 [IU] | INTRAMUSCULAR | Status: DC
  Administered 2022-01-01: 4 [IU] via SUBCUTANEOUS
  Administered 2022-01-01: 5 [IU] via SUBCUTANEOUS
  Administered 2022-01-02: 2 [IU] via SUBCUTANEOUS

## 2022-01-01 MED ORDER — MIDAZOLAM HCL 2 MG/2ML IJ SOLN
INTRAMUSCULAR | Status: AC
  Filled 2022-01-01: qty 2

## 2022-01-01 SURGICAL SUPPLY — 31 items
APL SKNCLS STERI-STRIP NONHPOA (GAUZE/BANDAGES/DRESSINGS) ×1
BENZOIN TINCTURE PRP APPL 2/3 (GAUZE/BANDAGES/DRESSINGS) ×2 IMPLANT
CLSR STERI-STRIP ANTIMIC 1/2X4 (GAUZE/BANDAGES/DRESSINGS) ×2 IMPLANT
DRAPE WARM FLUID 44X44 (DRAPES) ×2 IMPLANT
DRSG OPSITE POSTOP 4X10 (GAUZE/BANDAGES/DRESSINGS) ×2 IMPLANT
DURAPREP 26ML APPLICATOR (WOUND CARE) ×2 IMPLANT
GAUZE PAD ABD 8X10 STRL (GAUZE/BANDAGES/DRESSINGS) IMPLANT
GAUZE SPONGE 4X4 12PLY STRL LF (GAUZE/BANDAGES/DRESSINGS) IMPLANT
GLOVE BIO SURGEON STRL SZ 6 (GLOVE) IMPLANT
GLOVE BIO SURGEON STRL SZ 6.5 (GLOVE) ×2 IMPLANT
GLOVE BIOGEL PI IND STRL 7.0 (GLOVE) ×6 IMPLANT
GOWN STRL REUS W/TWL LRG LVL3 (GOWN DISPOSABLE) ×4 IMPLANT
HOLDER FOLEY CATH W/STRAP (MISCELLANEOUS) ×2 IMPLANT
KIT TURNOVER CYSTO (KITS) ×2 IMPLANT
MANIFOLD NEPTUNE II (INSTRUMENTS) ×2 IMPLANT
NEEDLE HYPO 22GX1.5 SAFETY (NEEDLE) ×2 IMPLANT
PACK ABDOMINAL GYN (CUSTOM PROCEDURE TRAY) ×2 IMPLANT
PAD OB MATERNITY 4.3X12.25 (PERSONAL CARE ITEMS) ×2 IMPLANT
SPONGE T-LAP 18X18 ~~LOC~~+RFID (SPONGE) ×4 IMPLANT
STRIP CLOSURE SKIN 1/2X4 (GAUZE/BANDAGES/DRESSINGS) IMPLANT
SUT PLAIN 2 0 XLH (SUTURE) ×2 IMPLANT
SUT VIC AB 0 CT1 18XCR BRD8 (SUTURE) ×4 IMPLANT
SUT VIC AB 0 CT1 27 (SUTURE) ×4
SUT VIC AB 0 CT1 27XBRD ANBCTR (SUTURE) ×8 IMPLANT
SUT VIC AB 0 CT1 8-18 (SUTURE) ×4
SUT VIC AB 4-0 KS 27 (SUTURE) ×2 IMPLANT
SUT VICRYL 0 TIES 12 18 (SUTURE) ×2 IMPLANT
SYR BULB IRRIG 60ML STRL (SYRINGE) IMPLANT
SYR CONTROL 10ML LL (SYRINGE) ×2 IMPLANT
TOWEL OR 17X26 10 PK STRL BLUE (TOWEL DISPOSABLE) ×4 IMPLANT
TRAY FOLEY W/BAG SLVR 14FR LF (SET/KITS/TRAYS/PACK) ×2 IMPLANT

## 2022-01-01 NOTE — Anesthesia Postprocedure Evaluation (Signed)
Anesthesia Post Note  Patient: Candice [name]  Procedure(s) Performed: ABDOMINAL HYSTERECTOMY WITH RIGHT SALPINGECTOMY AND LEFT SALPINGO-OOPHORECTOMY (Bilateral: Abdomen)     Patient location during evaluation: PACU Anesthesia Type: General Level of consciousness: awake and alert Pain management: pain level controlled Vital Signs Assessment: post-procedure vital signs reviewed and stable Respiratory status: spontaneous breathing, nonlabored ventilation and respiratory function stable Cardiovascular status: stable and blood pressure returned to baseline Anesthetic complications: no   No notable events documented.  Last Vitals:  Vitals:   01/01/22 0930 01/01/22 0934  BP: 137/83   Pulse: 91 85  Resp: (!) 22 20  Temp:    SpO2: 98% 93%    Last Pain:  Vitals:   01/01/22 0934  TempSrc:   PainSc: [place]

## 2022-01-01 NOTE — Progress Notes (Signed)
POD # 0  Sitting up and eating lunch Has ambulated. BP (!) 147/96 (BP Location: Right Arm)   Pulse 77   Temp 97.6 F (36.4 C) (Axillary)   Resp 16   Ht '5\' 10"'$  (1.778 m)   Wt 111.7 kg   LMP 12/14/2021 (Exact Date)   SpO2 99%   BMI 35.33 kg/m  Urine output excellent Abdomen soft  Bandage is clean and dry  Remove foley Ambulate Discharge home tomorrow

## 2022-01-01 NOTE — Op Note (Addendum)
Candice [name], Candice [name] MEDICAL RECORD NO: [id number] ACCOUNT NO: [id number] DATE OF BIRTH: 04/07/1975 FACILITY: [facility] LOCATION: [facility] PHYSICIAN: Kwana L. Helane Rima, MD  Operative Report   DATE OF PROCEDURE: 01/01/2022  PREOPERATIVE DIAGNOSES:  Pelvic pain, abnormal uterine bleeding, fibroids   POSTOPERATIVE DIAGNOSES:  Pelvic pain, abnormal uterine bleeding, fibroids and severe endometriosis from the left side of the pelvis involving the left tube and ovary.  SURGERY:  Total abdominal hysterectomy, right salpingectomy and left salpingo-oophorectomy.  SURGEON:  Blythe L. Helane Rima, MD.  ASSISTANT:  Dr. Hurshel Party.  ANESTHESIA:  General.  ESTIMATED BLOOD LOSS:  Less than 100 mL.  COMPLICATIONS:  None.  DRAINS:  Foley catheter.  DESCRIPTION OF PROCEDURE:  The patient was taken to the operating room.  She was intubated.  She was prepped and draped.  A low transverse incision was made, carried down to the fascia and extended laterally.  The rectus muscles were separated in the  midline.  The peritoneum was entered bluntly.  The peritoneal incision was then stretched.  A self-retaining retractor was placed in the abdominal cavity.  Large and small bowel were placed in the upper abdomen.  The uterus was inspected and noted to be  myomatous and enlarged.  We elevated the uterus with Kelly clamps on either side.  We could see that the left tube and ovary was severely diseased with significant endometriosis with some dense adhesions and hyperemia and the decision was made that we  would remove the left tube and ovary.  The right ovary appeared to be small and normal with no obvious endometriosis.  We then started the hysterectomy by suture ligating the round ligament on the right and suture ligating and then dividing the round  ligament.  We then placed a curved Heaney clamps beneath the mesosalpinx on the right.  Each pedicle was clamped, cut and suture ligated using 0 Vicryl suture.   We then went down and clamped the triple pedicle on the right and the pedicle suture ligated  using 0 Vicryl suture.  The bladder flap was created sharply.  We then turned our attention to the left side.  We went ahead and decided to remove the left tube and ovary after the removal of the uterus just so we can get better visualization, so we went  ahead and clamped the triple pedicle and the pedicle was clamped, cut and suture ligated using 0 Vicryl suture.  I elevated the uterus even more.  We then clamped the uterine arteries on either side using a curved Heaney clamp.  Each pedicle was  clamped, cut and suture ligated using 0 Vicryl suture.  We then placed straight Heaney clamps along the uterosacral ligaments walking our way down the cervix, staying very snug to the cervix.  Each pedicle was clamped, cut and suture ligated using 0  Vicryl suture.  We then reached the level of the external os, planning careful attention to the fat and the bladder was below our clamps and out of the way, each pedicle.  We then placed curved Heaney clamps across and just beneath the external os and  once we reached the level of vagina, then angle stitches were placed.  We removed the specimen, identified it as cervix and uterus and right fallopian tube.  The vaginal cuff was closed using a series of figure-of-eights using 0 Vicryl suture.   Irrigation was performed.  Hemostasis was very good.  At this point, we turned our attention to the left side of the  pelvis.  We were then able to visualize much more clearly with the uterus out of the way.  The left tube and ovary were grasped both with  a Babcock clamp and then I used a series of clamps with curved Heaney clamps by clamping just beneath the tube and ovary, staying away from the sidewall and away from the ureter.  We then removed the specimen, identified this as left tube and ovary and  the pedicles were secured using a free tie of 0 Vicryl suture and a suture ligature  of 0 Vicryl suture.  We then irrigated again and hemostasis was very good.  All pedicles were secure.  We then removed the laparotomy sponges and laparotomy instruments.   We then closed the peritoneum using 0 Vicryl.  We then closed the fascia using 0 Vicryl running stitch starting at each corner.  We then closed the subcutaneous with interrupteds of plain gut suture.  We closed the skin using 3-0 Vicryl on a Keith  needle.  Benzoin, Steri-Strips, honeycomb dressing and pressure dressing were applied.  All sponge, lap and instrument counts were correct x2.  Her urine output was adequate and was clear at the end of the case.  The patient was extubated and went to  recovery room in stable condition.        PAA D: 01/01/2022 5:12:04 pm T: 01/01/2022 11:05:00 pm  JOB: [id number]/ [id number]

## 2022-01-01 NOTE — Anesthesia Procedure Notes (Addendum)
Procedure Name: Intubation Date/Time: 01/01/2022 7:25 AM  Performed by: Rogers Blocker, CRNAPre-anesthesia Checklist: Patient identified, Emergency Drugs available, Suction available and Patient being monitored Patient Re-evaluated:Patient Re-evaluated prior to induction Oxygen Delivery Method: Circle System Utilized Preoxygenation: Pre-oxygenation with 100% oxygen Induction Type: IV induction Ventilation: Mask ventilation without difficulty Laryngoscope Size: Mac and 3 Grade View: Grade I Tube type: Oral Tube size: 7.0 mm Number of attempts: 1 Airway Equipment and Method: Stylet and Bite block Placement Confirmation: ETT inserted through vocal cords under direct vision, positive ETCO2 and breath sounds checked- equal and bilateral Secured at: 22 cm Tube secured with: Tape Dental Injury: Teeth and Oropharynx as per pre-operative assessment

## 2022-01-01 NOTE — H&P (Signed)
46 year old Female with large fibroid - reports lower abdominal pain has been to ER for the pain, also reports heavy menses. Past Medical History:  Diagnosis Date   Anxiety    occasional per pt on 12/27/21   COVID-19 04/26/2021   Diabetes mellitus without complication ([place])    Type 2, 08/19/21 A1c 9.2 in Epic, Follows w/ Leretha Pol, NP, LOV 10/26/21 in Epic.   GERD (gastroesophageal reflux disease)    Gout 12/2020   Pt denies   Migraines    Currently receiving Botox injections for migraines. Last Botox on 11/20/21 as of 12/25/21. Follws with Dr. Jaynee Eagles at [facility].   Neck pain    PONV (postoperative nausea and vomiting)    Wears contact lenses    Wears glasses    Past Surgical History:  Procedure Laterality Date   APPENDECTOMY  07/2019   @ [facility]   CESAREAN SECTION  2007   COLONOSCOPY  01/04/2021   COLONOSCOPY WITH ESOPHAGOGASTRODUODENOSCOPY (EGD)  08/12/2007   DILATION AND CURETTAGE OF UTERUS  2006   LUMBAR LAMINECTOMY  08/02/2020   L4-L5   MELANOMA EXCISION  2008   left breast and left upper third of back   TONSILLECTOMY AND ADENOIDECTOMY  07/13/2017   Family History  Problem Relation Age of Onset   Arthritis Mother    Diabetes Mother    Headache Mother    Colon polyps Mother    Hypertension Father    Diabetes Father    Breast cancer Maternal Aunt    Heart disease Maternal Grandfather    Heart attack Maternal Grandfather    Melanoma Paternal Grandmother    Lung cancer Paternal Grandfather    Migraines Neg Hx    Colon cancer Neg Hx    Esophageal cancer Neg Hx    Stomach cancer Neg Hx    Rectal cancer Neg Hx    Social History   Socioeconomic History   Marital status: Married    Spouse name: Not on file   Number of children: 2   Years of education: Not on file   Highest education level: Not on file  Occupational History   Occupation: NP  Tobacco Use   Smoking status: Never   Smokeless tobacco: Never  Vaping Use   Vaping Use: Never  used  Substance and Sexual Activity   Alcohol use: No   Drug use: No   Sexual activity: Yes    Partners: Male    Birth control/protection: None  Other Topics Concern   Not on file  Social History Narrative   Regular exercise-yes walking 3 times a week   Right handed   Diet vegetarian, fish   Caffeine: 8 oz AM   Social Determinants of Health   Financial Resource Strain: Not on file  Food Insecurity: Not on file  Transportation Needs: Not on file  Physical Activity: Not on file  Stress: Not on file  Social Connections: Not on file   Prior to Admission medications   Medication Sig Start Date End Date Taking? Authorizing Provider  ALPRAZolam Duanne Moron) 0.25 MG tablet Take 0.25 mg by mouth at bedtime as needed for anxiety.   Yes [provider]  metformin (FORTAMET) 500 MG (OSM) 24 hr tablet Take 1 tablet (500 mg total) by mouth 2 (two) times daily. 10/26/21  Yes Ronnell Freshwater, NP  Multiple Vitamin (MULTIVITAMIN) tablet Take 1 tablet by mouth daily.   Yes [provider]  botulinum toxin Type A (BOTOX) 100 units  SOLR injection Provider to inject 155 units into the muscles of the head and neck every 3 months. Discard remainder. 07/05/21   Melvenia Beam, MD  OZEMPIC, 1 MG/DOSE, 4 MG/3ML SOPN INJECT '1MG'$  AS DIRECTED ONCE A WEEK. NEEDS APPOINTMENT FOR FUTURE REFILLS 12/28/21   Ronnell Freshwater, NP  tretinoin (RETIN-A) 0.05 % cream Apply 1 application topically every other day. 11/20/20   [provider]   Allergies  Daptacel [diphth-acell pertussis-tetanus] and Milk-related compounds BP (!) 150/98   Pulse (!) 103   Temp 98.1 F (36.7 C) (Oral)   Resp 16   Ht '5\' 10"'$  (1.778 m)   Wt 111.7 kg   LMP 12/14/2021 (Exact Date)   SpO2 99%   BMI 35.33 kg/m  Results for orders placed or performed during the hospital encounter of 01/01/22 (from the past 24 hour(s))  Pregnancy, urine POC     Status: None   Collection Time: 01/01/22  5:46 AM  Result Value Ref Range    Preg Test, Ur NEGATIVE NEGATIVE  Glucose, capillary     Status: Abnormal   Collection Time: 01/01/22  6:37 AM  Result Value Ref Range   Glucose-Capillary 192 (H) 70 - 99 mg/dL   General alert and oriented Lung CTAB Car RRR Abdomen is soft and non tender Pelvic uterus is 16 week size and myomatous  IMPRESSION: Symptomatic fibroids Plan TAH and BS and only oophorectomy if pathology is noted at time of the surgery  Risks reviewed Consent is signed

## 2022-01-01 NOTE — Transfer of Care (Signed)
Immediate Anesthesia Transfer of Care Note  Patient: Candice [name]  Procedure(s) Performed: ABDOMINAL HYSTERECTOMY WITH RIGHT SALPINGECTOMY AND LEFT SALPINGO-OOPHORECTOMY (Bilateral: Abdomen)  Patient Location: PACU  Anesthesia Type:General  Level of Consciousness: awake, alert , oriented and patient cooperative  Airway & Oxygen Therapy: Patient Spontanous Breathing and Patient connected to nasal cannula oxygen  Post-op Assessment: Report given to RN and Post -op Vital signs reviewed and stable  Post vital signs: Reviewed and stable  Last Vitals:  Vitals Value Taken Time  BP 146/91 01/01/22 0851  Temp    Pulse 84 01/01/22 0856  Resp 17 01/01/22 0856  SpO2 95 % 01/01/22 0856  Vitals shown include unvalidated device data.  Last Pain:  Vitals:   01/01/22 0603  TempSrc: Oral  PainSc: 0-No pain      Patients Stated Pain Goal: 5 (91/91/66 0600)  Complications: No notable events documented.

## 2022-01-01 NOTE — Brief Op Note (Signed)
01/01/2022  8:48 AM  PATIENT:  Candice [name]  46 y.o. female  PRE-OPERATIVE DIAGNOSIS:  FIBROIDS; PELVIC PAIN  POST-OPERATIVE DIAGNOSIS:  FIBROIDS; PELVIC PAIN, ENDOMETRIOSIS  PROCEDURE:  Procedure(s): ABDOMINAL HYSTERECTOMY WITH RIGHT SALPINGECTOMY AND LEFT SALPINGO-OOPHORECTOMY (Bilateral)  SURGEON:  Surgeon(s) and Role:    * Dian Queen, MD - Primary    * Charlotta Newton, MD - Assisting  PHYSICIAN ASSISTANT:   ASSISTANTS: none   ANESTHESIA:   general  EBL:  100 mL   BLOOD ADMINISTERED:none  DRAINS: Urinary Catheter (Foley)   LOCAL MEDICATIONS USED:  NONE  SPECIMEN:  Source of Specimen:  left tube and ovary and uterus and cervix and right tube   DISPOSITION OF SPECIMEN:  PATHOLOGY  COUNTS:  YES  TOURNIQUET:  * No tourniquets in log *  DICTATION: .Other Dictation: Dictation Number dictated  PLAN OF CARE: Admit for overnight observation  PATIENT DISPOSITION:  PACU - hemodynamically stable.   Delay start of Pharmacological VTE agent (>24hrs) due to surgical blood loss or risk of bleeding: not applicable

## 2022-01-02 ENCOUNTER — Telehealth: Payer: Self-pay | Admitting: *Deleted

## 2022-01-02 ENCOUNTER — Encounter (HOSPITAL_BASED_OUTPATIENT_CLINIC_OR_DEPARTMENT_OTHER): Payer: Self-pay | Admitting: Obstetrics and Gynecology

## 2022-01-02 DIAGNOSIS — R102 Pelvic and perineal pain: Secondary | ICD-10-CM | POA: Diagnosis not present

## 2022-01-02 LAB — CBC
HCT: 32.1 % — ABNORMAL LOW (ref 36.0–46.0)
Hemoglobin: 9.5 g/dL — ABNORMAL LOW (ref 12.0–15.0)
MCH: 22 pg — ABNORMAL LOW (ref 26.0–34.0)
MCHC: 29.6 g/dL — ABNORMAL LOW (ref 30.0–36.0)
MCV: 74.3 fL — ABNORMAL LOW (ref 80.0–100.0)
Platelets: 273 10*3/uL (ref 150–400)
RBC: 4.32 MIL/uL (ref 3.87–5.11)
RDW: 15.3 % (ref 11.5–15.5)
WBC: 11.5 10*3/uL — ABNORMAL HIGH (ref 4.0–10.5)
nRBC: 0 % (ref 0.0–0.2)

## 2022-01-02 LAB — GLUCOSE, CAPILLARY: Glucose-Capillary: 133 mg/dL — ABNORMAL HIGH (ref 70–99)

## 2022-01-02 MED ORDER — IBUPROFEN 200 MG PO TABS
ORAL_TABLET | ORAL | Status: AC
  Filled 2022-01-02: qty 3

## 2022-01-02 MED ORDER — OXYCODONE HCL 5 MG PO TABS: 5.0000 mg | ORAL_TABLET | ORAL | 0 refills | Status: DC | PRN

## 2022-01-02 MED ORDER — OXYCODONE HCL 5 MG PO TABS
ORAL_TABLET | ORAL | Status: AC
  Filled 2022-01-02: qty 2

## 2022-01-02 MED ORDER — ACETAMINOPHEN 325 MG PO TABS
ORAL_TABLET | ORAL | Status: AC
  Filled 2022-01-02: qty 2

## 2022-01-02 NOTE — Discharge Summary (Signed)
Admission Diagnosis: Symptomatic Fibroid Pelvic Pain  Discharge Diagnosis: Same Endometriosis  Hospital Course: 46 year old female underwent TAH and Right salpingectomy and LSO.  Patient had uneventful post op course. By POD # 1 she was ambulating, tolerating regular diet and had good pain control. BP 129/87 (BP Location: Right Arm)   Pulse 94   Temp 98.8 F (37.1 C)   Resp 16   Ht '5\' 10"'$  (1.778 m)   Wt 111.7 kg   LMP 12/14/2021 (Exact Date)   SpO2 94%   BMI 35.33 kg/m  Results for orders placed or performed during the hospital encounter of 01/01/22 (from the past 24 hour(s))  Glucose, capillary     Status: Abnormal   Collection Time: 01/01/22  9:11 AM  Result Value Ref Range   Glucose-Capillary 253 (H) 70 - 99 mg/dL  Glucose, capillary     Status: Abnormal   Collection Time: 01/01/22 10:11 AM  Result Value Ref Range   Glucose-Capillary 215 (H) 70 - 99 mg/dL  Glucose, capillary     Status: Abnormal   Collection Time: 01/01/22  5:31 PM  Result Value Ref Range   Glucose-Capillary 162 (H) 70 - 99 mg/dL  Glucose, capillary     Status: Abnormal   Collection Time: 01/01/22 10:35 PM  Result Value Ref Range   Glucose-Capillary 105 (H) 70 - 99 mg/dL  CBC     Status: Abnormal   Collection Time: 01/02/22  2:24 AM  Result Value Ref Range   WBC 11.5 (H) 4.0 - 10.5 K/uL   RBC 4.32 3.87 - 5.11 MIL/uL   Hemoglobin 9.5 (L) 12.0 - 15.0 g/dL   HCT 32.1 (L) 36.0 - 46.0 %   MCV 74.3 (L) 80.0 - 100.0 fL   MCH 22.0 (L) 26.0 - 34.0 pg   MCHC 29.6 (L) 30.0 - 36.0 g/dL   RDW 15.3 11.5 - 15.5 %   Platelets 273 150 - 400 K/uL   nRBC 0.0 0.0 - 0.2 %  Glucose, capillary     Status: Abnormal   Collection Time: 01/02/22  7:25 AM  Result Value Ref Range   Glucose-Capillary 133 (H) 70 - 99 mg/dL   General alert and oriented Lung CTAB Car RRR Abdomen is soft and non tender Incision is clean and dry and intact  New honeycomb is applied   Patient sent home in good  condition Follow up in 1  week Discharge instructions given to patient

## 2022-01-02 NOTE — Telephone Encounter (Signed)
Torwanda  from [facility] called to get patients Diagnosis code to process Botox

## 2022-01-04 LAB — SURGICAL PATHOLOGY

## 2022-01-28 ENCOUNTER — Encounter: Payer: Self-pay | Admitting: Neurology

## 2022-01-28 ENCOUNTER — Telehealth: Payer: Self-pay | Admitting: *Deleted

## 2022-01-28 ENCOUNTER — Encounter: Payer: Self-pay | Admitting: Nurse Practitioner

## 2022-01-28 DIAGNOSIS — R7989 Other specified abnormal findings of blood chemistry: Secondary | ICD-10-CM

## 2022-01-28 DIAGNOSIS — R000 Tachycardia, unspecified: Secondary | ICD-10-CM

## 2022-01-28 DIAGNOSIS — R0602 Shortness of breath: Secondary | ICD-10-CM

## 2022-01-28 NOTE — Telephone Encounter (Signed)
Chronic Migraine CPT 64615  Botox [id number] Units:200  G43.709 Chronic Migraine without aura, not intractable, without status migrainous  Needs re-auth for Botox

## 2022-01-29 ENCOUNTER — Other Ambulatory Visit (HOSPITAL_COMMUNITY): Payer: Self-pay

## 2022-01-29 NOTE — Telephone Encounter (Signed)
Patient Advocate Encounter   Received notification that prior authorization for Botox 200UNIT solution is required.   PA submitted on 01/29/2022 Key [id number] Status is pending       Lyndel Safe, Gutierrez Patient Advocate Specialist [facility] Patient Advocate Team Direct Number: [phone]  Fax: [phone]

## 2022-01-30 NOTE — Telephone Encounter (Signed)
BotoxOne Benefit Verification [place]

## 2022-02-02 ENCOUNTER — Encounter: Payer: Self-pay | Admitting: Neurology

## 2022-02-02 DIAGNOSIS — J069 Acute upper respiratory infection, unspecified: Secondary | ICD-10-CM

## 2022-02-04 MED ORDER — METHYLPREDNISOLONE 4 MG PO TBPK: ORAL_TABLET | ORAL | 0 refills | Status: DC

## 2022-02-04 NOTE — Telephone Encounter (Signed)
Called [facility] to schedule delivery, they are saying that pt's botox has to go under medical review that may take 24-48 hours. I will follow up within the next few days.

## 2022-02-05 NOTE — Telephone Encounter (Signed)
Thanks

## 2022-02-06 NOTE — Telephone Encounter (Signed)
Great, appt note updated.

## 2022-02-06 NOTE — Telephone Encounter (Signed)
Candice [name] is calling from [facility]. Stated Botox will be delivered tomorrow between 8 am-4:30 pm.

## 2022-02-12 ENCOUNTER — Ambulatory Visit (INDEPENDENT_AMBULATORY_CARE_PROVIDER_SITE_OTHER): Payer: 59 | Admitting: Neurology

## 2022-02-12 DIAGNOSIS — G43709 Chronic migraine without aura, not intractable, without status migrainosus: Secondary | ICD-10-CM | POA: Diagnosis not present

## 2022-02-12 MED ORDER — ONABOTULINUMTOXINA 100 UNITS IJ SOLR
155.0000 [IU] | Freq: Once | INTRAMUSCULAR | Status: AC
  Administered 2022-02-12: 155 [IU] via INTRAMUSCULAR

## 2022-02-12 NOTE — Progress Notes (Signed)
Botox- 100 units x 2 vials Lot: [id number] Expiration: 05/2024 NDC: 5747-3403-70  Bacteriostatic 0.9% Sodium Chloride- 31m total Lot: [id number]: 11/03/2022  NDC: [address]-43 Dx::G43.709 S/P

## 2022-02-12 NOTE — Progress Notes (Signed)
02/12/2022: Only 4 migraines since last botox, nurtec helps 08/27/2021: Excellent response, > 80% mprovement in migraine freq and severity. She left [place] [facility] care after they changed to increase workload, with many and dr tower   Consent Form Botulism Toxin Injection For Chronic Migraine    Reviewed orally with patient, additionally signature is on file:  Botulism toxin has been approved by the Federal drug administration for treatment of chronic migraine. Botulism toxin does not cure chronic migraine and it may not be effective in some patients.  The administration of botulism toxin is accomplished by injecting a small amount of toxin into the muscles of the neck and head. Dosage must be titrated for each individual. Any benefits resulting from botulism toxin tend to wear off after 3 months with a repeat injection required if benefit is to be maintained. Injections are usually done every 3-4 months with maximum effect peak achieved by about 2 or 3 weeks. Botulism toxin is expensive and you should be sure of what costs you will incur resulting from the injection.  The side effects of botulism toxin use for chronic migraine may include:   -Transient, and usually mild, facial weakness with facial injections  -Transient, and usually mild, head or neck weakness with head/neck injections  -Reduction or loss of forehead facial animation due to forehead muscle weakness  -Eyelid drooping  -Dry eye  -Pain at the site of injection or bruising at the site of injection  -Double vision  -Potential unknown long term risks  Contraindications: You should not have Botox if you are pregnant, nursing, allergic to albumin, have an infection, skin condition, or muscle weakness at the site of the injection, or have myasthenia gravis, Lambert-Eaton syndrome, or ALS.  It is also possible that as with any injection, there may be an allergic reaction or no effect from the medication. Reduced effectiveness after  repeated injections is sometimes seen and rarely infection at the injection site may occur. All care will be taken to prevent these side effects. If therapy is given over a long time, atrophy and wasting in the muscle injected may occur. Occasionally the patient's become refractory to treatment because they develop antibodies to the toxin. In this event, therapy needs to be modified.  I have read the above information and consent to the administration of botulism toxin.    BOTOX PROCEDURE NOTE FOR MIGRAINE HEADACHE    Contraindications and precautions discussed with patient(above). Aseptic procedure was observed and patient tolerated procedure. Procedure performed by Dr. Georgia Dom  The condition has existed for more than 6 months, and pt does not have a diagnosis of ALS, Myasthenia Gravis or Lambert-Eaton Syndrome.  Risks and benefits of injections discussed and pt agrees to proceed with the procedure.  Written consent obtained  These injections are medically necessary. Pt  receives good benefits from these injections. These injections do not cause sedations or hallucinations which the oral therapies may cause.  Description of procedure:  The patient was placed in a sitting position. The standard protocol was used for Botox as follows, with 5 units of Botox injected at each site:   -Procerus muscle, midline injection  -Corrugator muscle, bilateral injection  -Frontalis muscle, bilateral injection, with 2 sites each side, medial injection was performed in the upper one third of the frontalis muscle, in the region vertical from the medial inferior edge of the superior orbital rim. The lateral injection was again in the upper one third of the forehead vertically above the lateral limbus  of the cornea, 1.5 cm lateral to the medial injection site.  -Temporalis muscle injection, 4 sites, bilaterally. The first injection was 3 cm above the tragus of the ear, second injection site was 1.5 cm to 3  cm up from the first injection site in line with the tragus of the ear. The third injection site was 1.5-3 cm forward between the first 2 injection sites. The fourth injection site was 1.5 cm posterior to the second injection site.   -Occipitalis muscle injection, 3 sites, bilaterally. The first injection was done one half way between the occipital protuberance and the tip of the mastoid process behind the ear. The second injection site was done lateral and superior to the first, 1 fingerbreadth from the first injection. The third injection site was 1 fingerbreadth superiorly and medially from the first injection site.  -Cervical paraspinal muscle injection, 2 sites, bilateral knee first injection site was 1 cm from the midline of the cervical spine, 3 cm inferior to the lower border of the occipital protuberance. The second injection site was 1.5 cm superiorly and laterally to the first injection site.  -Trapezius muscle injection was performed at 3 sites, bilaterally. The first injection site was in the upper trapezius muscle halfway between the inflection point of the neck, and the acromion. The second injection site was one half way between the acromion and the first injection site. The third injection was done between the first injection site and the inflection point of the neck.   Will return for repeat injection in 3 months.   155 units of Botox was used, 45 Botox not injected was wasted. The patient tolerated the procedure well, there were no complications of the above procedure.

## 2022-04-14 ENCOUNTER — Telehealth: Payer: 59 | Admitting: Physician Assistant

## 2022-04-14 DIAGNOSIS — R051 Acute cough: Secondary | ICD-10-CM | POA: Diagnosis not present

## 2022-04-14 DIAGNOSIS — J019 Acute sinusitis, unspecified: Secondary | ICD-10-CM

## 2022-04-14 MED ORDER — AMOXICILLIN-POT CLAVULANATE 875-125 MG PO TABS: 1.0000 | ORAL_TABLET | Freq: Two times a day (BID) | ORAL | 0 refills | Status: DC

## 2022-04-14 MED ORDER — PREDNISONE 20 MG PO TABS: 40.0000 mg | ORAL_TABLET | Freq: Every day | ORAL | 0 refills | Status: AC

## 2022-04-14 MED ORDER — BENZONATATE 100 MG PO CAPS: 100.0000 mg | ORAL_CAPSULE | Freq: Three times a day (TID) | ORAL | 0 refills | Status: DC | PRN

## 2022-04-14 NOTE — Progress Notes (Signed)
E-Visit for Sinus Problems  We are sorry that you are not feeling well.  Here is how we plan to help!  Based on what you have shared with me it looks like you have sinusitis.  Sinusitis is inflammation and infection in the sinus cavities of the head.  Based on your presentation I believe you most likely have Acute Bacterial Sinusitis.  This is an infection caused by bacteria and is treated with antibiotics. I have prescribed Augmentin 839m/125mg one tablet twice daily with food, for 7 days. You may use an oral decongestant such as Mucinex D or if you have glaucoma or high blood pressure use plain Mucinex. Saline nasal spray help and can safely be used as often as needed for congestion.  If you develop worsening sinus pain, fever or notice severe headache and vision changes, or if symptoms are not better after completion of antibiotic, please schedule an appointment with a health care provider.    Sinus infections are not as easily transmitted as other respiratory infection, however we still recommend that you avoid close contact with loved ones, especially the very young and elderly.  Remember to wash your hands thoroughly throughout the day as this is the number one way to prevent the spread of infection!  I have also sent in prednisone and tessalon pearls for cough.  Home Care: Only take medications as instructed by your medical team. Complete the entire course of an antibiotic. Do not take these medications with alcohol. A steam or ultrasonic humidifier can help congestion.  You can place a towel over your head and breathe in the steam from hot water coming from a faucet. Avoid close contacts especially the very young and the elderly. Cover your mouth when you cough or sneeze. Always remember to wash your hands.  Get Help Right Away If: You develop worsening fever or sinus pain. You develop a severe head ache or visual changes. Your symptoms persist after you have completed your treatment  plan.  Make sure you Understand these instructions. Will watch your condition. Will get help right away if you are not doing well or get worse.  Thank you for choosing an e-visit.  Your e-visit answers were reviewed by a board certified advanced clinical practitioner to complete your personal care plan. Depending upon the condition, your plan could have included both over the counter or prescription medications.  Please review your pharmacy choice. Make sure the pharmacy is open so you can pick up prescription now. If there is a problem, you may contact your provider through [employer] have the prescription routed to another pharmacy.  Your safety is important to [place] If you have drug allergies check your prescription carefully.   For the next 24 hours you can use MyChart to ask questions about today's visit, request a non-urgent call back, or ask for a work or school excuse. You will get an email in the next two days asking about your experience. I hope that your e-visit has been valuable and will speed your recovery.   I have spent 5 minutes in review of e-visit questionnaire, review and updating patient chart, medical decision making and response to patient.   JLenise ArenaWard, PA-C

## 2022-04-25 ENCOUNTER — Telehealth: Payer: Self-pay | Admitting: Neurology

## 2022-04-25 NOTE — Telephone Encounter (Signed)
Botox delivery scheduled for Tues 27th [address]

## 2022-05-13 ENCOUNTER — Encounter: Payer: Self-pay | Admitting: Neurology

## 2022-05-13 ENCOUNTER — Ambulatory Visit (INDEPENDENT_AMBULATORY_CARE_PROVIDER_SITE_OTHER): Payer: 59 | Admitting: Neurology

## 2022-05-13 DIAGNOSIS — G43709 Chronic migraine without aura, not intractable, without status migrainosus: Secondary | ICD-10-CM | POA: Diagnosis not present

## 2022-05-13 MED ORDER — ONABOTULINUMTOXINA 100 UNITS IJ SOLR
155.0000 [IU] | Freq: Once | INTRAMUSCULAR | Status: AC
  Administered 2022-05-13: 155 [IU] via INTRAMUSCULAR

## 2022-05-13 NOTE — Progress Notes (Signed)
05/13/2022: Stable 02/12/2022: Only 4 migraines since last botox, nurtec helps 08/27/2021: Excellent response, > 80% mprovement in migraine freq and severity. She left [place] [facility] after they changed to increase workload, with many and dr tower   Consent Form Botulism Toxin Injection For Chronic Migraine    Reviewed orally with patient, additionally signature is on file:  Botulism toxin has been approved by the Federal drug administration for treatment of chronic migraine. Botulism toxin does not cure chronic migraine and it may not be effective in some patients.  The administration of botulism toxin is accomplished by injecting a small amount of toxin into the muscles of the neck and head. Dosage must be titrated for each individual. Any benefits resulting from botulism toxin tend to wear off after 3 months with a repeat injection required if benefit is to be maintained. Injections are usually done every 3-4 months with maximum effect peak achieved by about 2 or 3 weeks. Botulism toxin is expensive and you should be sure of what costs you will incur resulting from the injection.  The side effects of botulism toxin use for chronic migraine may include:   -Transient, and usually mild, facial weakness with facial injections  -Transient, and usually mild, head or neck weakness with head/neck injections  -Reduction or loss of forehead facial animation due to forehead muscle weakness  -Eyelid drooping  -Dry eye  -Pain at the site of injection or bruising at the site of injection  -Double vision  -Potential unknown long term risks  Contraindications: You should not have Botox if you are pregnant, nursing, allergic to albumin, have an infection, skin condition, or muscle weakness at the site of the injection, or have myasthenia gravis, Lambert-Eaton syndrome, or ALS.  It is also possible that as with any injection, there may be an allergic reaction or no effect from the medication. Reduced  effectiveness after repeated injections is sometimes seen and rarely infection at the injection site may occur. All care will be taken to prevent these side effects. If therapy is given over a long time, atrophy and wasting in the muscle injected may occur. Occasionally the patient's become refractory to treatment because they develop antibodies to the toxin. In this event, therapy needs to be modified.  I have read the above information and consent to the administration of botulism toxin.    BOTOX PROCEDURE NOTE FOR MIGRAINE HEADACHE    Contraindications and precautions discussed with patient(above). Aseptic procedure was observed and patient tolerated procedure. Procedure performed by Dr. Georgia Dom  The condition has existed for more than 6 months, and pt does not have a diagnosis of ALS, Myasthenia Gravis or Lambert-Eaton Syndrome.  Risks and benefits of injections discussed and pt agrees to proceed with the procedure.  Written consent obtained  These injections are medically necessary. Pt  receives good benefits from these injections. These injections do not cause sedations or hallucinations which the oral therapies may cause.  Description of procedure:  The patient was placed in a sitting position. The standard protocol was used for Botox as follows, with 5 units of Botox injected at each site:   -Procerus muscle, midline injection  -Corrugator muscle, bilateral injection  -Frontalis muscle, bilateral injection, with 2 sites each side, medial injection was performed in the upper one third of the frontalis muscle, in the region vertical from the medial inferior edge of the superior orbital rim. The lateral injection was again in the upper one third of the forehead vertically above the  lateral limbus of the cornea, 1.5 cm lateral to the medial injection site.  -Temporalis muscle injection, 4 sites, bilaterally. The first injection was 3 cm above the tragus of the ear, second injection  site was 1.5 cm to 3 cm up from the first injection site in line with the tragus of the ear. The third injection site was 1.5-3 cm forward between the first 2 injection sites. The fourth injection site was 1.5 cm posterior to the second injection site.   -Occipitalis muscle injection, 3 sites, bilaterally. The first injection was done one half way between the occipital protuberance and the tip of the mastoid process behind the ear. The second injection site was done lateral and superior to the first, 1 fingerbreadth from the first injection. The third injection site was 1 fingerbreadth superiorly and medially from the first injection site.  -Cervical paraspinal muscle injection, 2 sites, bilateral knee first injection site was 1 cm from the midline of the cervical spine, 3 cm inferior to the lower border of the occipital protuberance. The second injection site was 1.5 cm superiorly and laterally to the first injection site.  -Trapezius muscle injection was performed at 3 sites, bilaterally. The first injection site was in the upper trapezius muscle halfway between the inflection point of the neck, and the acromion. The second injection site was one half way between the acromion and the first injection site. The third injection was done between the first injection site and the inflection point of the neck.   Will return for repeat injection in 3 months.   155 units of Botox was used, 45 Botox not injected was wasted. The patient tolerated the procedure well, there were no complications of the above procedure.

## 2022-05-13 NOTE — Progress Notes (Signed)
Botox- 100 units x 2 vials Lot: [id number] Expiration: 05/2024 NDC: [id number]  Bacteriostatic 0.9% Sodium Chloride- 81m total Lot: [id number]: 01/2024 NDC: [id number] Dx: [id number] Checked by: SSkeet SimmerCMA

## 2022-05-24 ENCOUNTER — Other Ambulatory Visit: Payer: Self-pay | Admitting: Nurse Practitioner

## 2022-05-24 DIAGNOSIS — E1165 Type 2 diabetes mellitus with hyperglycemia: Secondary | ICD-10-CM

## 2022-07-14 ENCOUNTER — Other Ambulatory Visit: Payer: Self-pay | Admitting: Neurology

## 2022-07-18 ENCOUNTER — Telehealth: Payer: Self-pay | Admitting: *Deleted

## 2022-07-18 NOTE — Telephone Encounter (Signed)
Kim from [employer] is asking for the diagnosis code for Botox.  I took call and relayed that ICD 10 is G43.709. she said that is what she needed.  Called ended.

## 2022-08-09 ENCOUNTER — Encounter: Payer: Self-pay | Admitting: Neurology

## 2022-08-09 NOTE — Telephone Encounter (Signed)
Noted  

## 2022-08-12 ENCOUNTER — Encounter: Payer: Self-pay | Admitting: Neurology

## 2022-08-12 ENCOUNTER — Ambulatory Visit: Payer: 59 | Admitting: Neurology

## 2022-08-12 NOTE — Progress Notes (Deleted)
05/13/2022: Stable 02/12/2022: Only 4 migraines since last botox, nurtec helps 08/27/2021: Excellent response, > 80% mprovement in migraine freq and severity. She left [place] [facility] after they changed to increase workload, with many and dr tower   Consent Form Botulism Toxin Injection For Chronic Migraine    Reviewed orally with patient, additionally signature is on file:  Botulism toxin has been approved by the Federal drug administration for treatment of chronic migraine. Botulism toxin does not cure chronic migraine and it may not be effective in some patients.  The administration of botulism toxin is accomplished by injecting a small amount of toxin into the muscles of the neck and head. Dosage must be titrated for each individual. Any benefits resulting from botulism toxin tend to wear off after 3 months with a repeat injection required if benefit is to be maintained. Injections are usually done every 3-4 months with maximum effect peak achieved by about 2 or 3 weeks. Botulism toxin is expensive and you should be sure of what costs you will incur resulting from the injection.  The side effects of botulism toxin use for chronic migraine may include:   -Transient, and usually mild, facial weakness with facial injections  -Transient, and usually mild, head or neck weakness with head/neck injections  -Reduction or loss of forehead facial animation due to forehead muscle weakness  -Eyelid drooping  -Dry eye  -Pain at the site of injection or bruising at the site of injection  -Double vision  -Potential unknown long term risks  Contraindications: You should not have Botox if you are pregnant, nursing, allergic to albumin, have an infection, skin condition, or muscle weakness at the site of the injection, or have myasthenia gravis, Lambert-Eaton syndrome, or ALS.  It is also possible that as with any injection, there may be an allergic reaction or no effect from the medication. Reduced  effectiveness after repeated injections is sometimes seen and rarely infection at the injection site may occur. All care will be taken to prevent these side effects. If therapy is given over a long time, atrophy and wasting in the muscle injected may occur. Occasionally the patient's become refractory to treatment because they develop antibodies to the toxin. In this event, therapy needs to be modified.  I have read the above information and consent to the administration of botulism toxin.    BOTOX PROCEDURE NOTE FOR MIGRAINE HEADACHE    Contraindications and precautions discussed with patient(above). Aseptic procedure was observed and patient tolerated procedure. Procedure performed by Dr. Toni Bekim Werntz  The condition has existed for more than 6 months, and pt does not have a diagnosis of ALS, Myasthenia Gravis or Lambert-Eaton Syndrome.  Risks and benefits of injections discussed and pt agrees to proceed with the procedure.  Written consent obtained  These injections are medically necessary. Pt  receives good benefits from these injections. These injections do not cause sedations or hallucinations which the oral therapies may cause.  Description of procedure:  The patient was placed in a sitting position. The standard protocol was used for Botox as follows, with 5 units of Botox injected at each site:   -Procerus muscle, midline injection  -Corrugator muscle, bilateral injection  -Frontalis muscle, bilateral injection, with 2 sites each side, medial injection was performed in the upper one third of the frontalis muscle, in the region vertical from the medial inferior edge of the superior orbital rim. The lateral injection was again in the upper one third of the forehead vertically above the   lateral limbus of the cornea, 1.5 cm lateral to the medial injection site.  -Temporalis muscle injection, 4 sites, bilaterally. The first injection was 3 cm above the tragus of the ear, second injection  site was 1.5 cm to 3 cm up from the first injection site in line with the tragus of the ear. The third injection site was 1.5-3 cm forward between the first 2 injection sites. The fourth injection site was 1.5 cm posterior to the second injection site.   -Occipitalis muscle injection, 3 sites, bilaterally. The first injection was done one half way between the occipital protuberance and the tip of the mastoid process behind the ear. The second injection site was done lateral and superior to the first, 1 fingerbreadth from the first injection. The third injection site was 1 fingerbreadth superiorly and medially from the first injection site.  -Cervical paraspinal muscle injection, 2 sites, bilateral knee first injection site was 1 cm from the midline of the cervical spine, 3 cm inferior to the lower border of the occipital protuberance. The second injection site was 1.5 cm superiorly and laterally to the first injection site.  -Trapezius muscle injection was performed at 3 sites, bilaterally. The first injection site was in the upper trapezius muscle halfway between the inflection point of the neck, and the acromion. The second injection site was one half way between the acromion and the first injection site. The third injection was done between the first injection site and the inflection point of the neck.   Will return for repeat injection in 3 months.   155 units of Botox was used, 45 Botox not injected was wasted. The patient tolerated the procedure well, there were no complications of the above procedure. 

## 2022-08-15 ENCOUNTER — Encounter: Payer: Self-pay | Admitting: Neurology

## 2022-08-15 ENCOUNTER — Ambulatory Visit (INDEPENDENT_AMBULATORY_CARE_PROVIDER_SITE_OTHER): Payer: 59 | Admitting: Neurology

## 2022-08-15 DIAGNOSIS — G43709 Chronic migraine without aura, not intractable, without status migrainosus: Secondary | ICD-10-CM

## 2022-08-15 MED ORDER — ONABOTULINUMTOXINA 100 UNITS IJ SOLR
155.0000 [IU] | Freq: Once | INTRAMUSCULAR | Status: AC
  Administered 2022-08-15: 155 [IU] via INTRAMUSCULAR

## 2022-08-15 NOTE — Progress Notes (Signed)
08/15/2022 stable and still doing great; Excellent response, > 80% mprovement in migraine freq and severity. 05/13/2022: Stable 02/12/2022: Only 4 migraines since last botox, nurtec helps 08/27/2021: Excellent response, > 80% mprovement in migraine freq and severity. She left [place] [facility] after they changed to increase workload, with many and dr tower   Consent Form Botulism Toxin Injection For Chronic Migraine    Reviewed orally with patient, additionally signature is on file:  Botulism toxin has been approved by the Federal drug administration for treatment of chronic migraine. Botulism toxin does not cure chronic migraine and it may not be effective in some patients.  The administration of botulism toxin is accomplished by injecting a small amount of toxin into the muscles of the neck and head. Dosage must be titrated for each individual. Any benefits resulting from botulism toxin tend to wear off after 3 months with a repeat injection required if benefit is to be maintained. Injections are usually done every 3-4 months with maximum effect peak achieved by about 2 or 3 weeks. Botulism toxin is expensive and you should be sure of what costs you will incur resulting from the injection.  The side effects of botulism toxin use for chronic migraine may include:   -Transient, and usually mild, facial weakness with facial injections  -Transient, and usually mild, head or neck weakness with head/neck injections  -Reduction or loss of forehead facial animation due to forehead muscle weakness  -Eyelid drooping  -Dry eye  -Pain at the site of injection or bruising at the site of injection  -Double vision  -Potential unknown long term risks  Contraindications: You should not have Botox if you are pregnant, nursing, allergic to albumin, have an infection, skin condition, or muscle weakness at the site of the injection, or have myasthenia gravis, Lambert-Eaton syndrome, or ALS.  It is also  possible that as with any injection, there may be an allergic reaction or no effect from the medication. Reduced effectiveness after repeated injections is sometimes seen and rarely infection at the injection site may occur. All care will be taken to prevent these side effects. If therapy is given over a long time, atrophy and wasting in the muscle injected may occur. Occasionally the patient's become refractory to treatment because they develop antibodies to the toxin. In this event, therapy needs to be modified.  I have read the above information and consent to the administration of botulism toxin.    BOTOX PROCEDURE NOTE FOR MIGRAINE HEADACHE    Contraindications and precautions discussed with patient(above). Aseptic procedure was observed and patient tolerated procedure. Procedure performed by Dr. Artemio Aly  The condition has existed for more than 6 months, and pt does not have a diagnosis of ALS, Myasthenia Gravis or Lambert-Eaton Syndrome.  Risks and benefits of injections discussed and pt agrees to proceed with the procedure.  Written consent obtained  These injections are medically necessary. Pt  receives good benefits from these injections. These injections do not cause sedations or hallucinations which the oral therapies may cause.  Description of procedure:  The patient was placed in a sitting position. The standard protocol was used for Botox as follows, with 5 units of Botox injected at each site:   -Procerus muscle, midline injection  -Corrugator muscle, bilateral injection  -Frontalis muscle, bilateral injection, with 2 sites each side, medial injection was performed in the upper one third of the frontalis muscle, in the region vertical from the medial inferior edge of the superior orbital rim.  The lateral injection was again in the upper one third of the forehead vertically above the lateral limbus of the cornea, 1.5 cm lateral to the medial injection site.  -Temporalis  muscle injection, 4 sites, bilaterally. The first injection was 3 cm above the tragus of the ear, second injection site was 1.5 cm to 3 cm up from the first injection site in line with the tragus of the ear. The third injection site was 1.5-3 cm forward between the first 2 injection sites. The fourth injection site was 1.5 cm posterior to the second injection site.   -Occipitalis muscle injection, 3 sites, bilaterally. The first injection was done one half way between the occipital protuberance and the tip of the mastoid process behind the ear. The second injection site was done lateral and superior to the first, 1 fingerbreadth from the first injection. The third injection site was 1 fingerbreadth superiorly and medially from the first injection site.  -Cervical paraspinal muscle injection, 2 sites, bilateral knee first injection site was 1 cm from the midline of the cervical spine, 3 cm inferior to the lower border of the occipital protuberance. The second injection site was 1.5 cm superiorly and laterally to the first injection site.  -Trapezius muscle injection was performed at 3 sites, bilaterally. The first injection site was in the upper trapezius muscle halfway between the inflection point of the neck, and the acromion. The second injection site was one half way between the acromion and the first injection site. The third injection was done between the first injection site and the inflection point of the neck.   Will return for repeat injection in 3 months.   155 units of Botox was used, 45 Botox not injected was wasted. The patient tolerated the procedure well, there were no complications of the above procedure.

## 2022-08-15 NOTE — Progress Notes (Signed)
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

## 2022-08-23 ENCOUNTER — Ambulatory Visit
Admission: RE | Admit: 2022-08-23 | Discharge: 2022-08-23 | Disposition: A | Discharge status: Home / Self Care | Payer: 59 | Source: Ambulatory Visit | Attending: Family | Admitting: Family

## 2022-08-23 ENCOUNTER — Other Ambulatory Visit: Payer: Self-pay | Admitting: Family

## 2022-08-23 DIAGNOSIS — M7989 Other specified soft tissue disorders: Secondary | ICD-10-CM

## 2022-08-23 DIAGNOSIS — M79604 Pain in right leg: Secondary | ICD-10-CM

## 2022-11-05 ENCOUNTER — Other Ambulatory Visit: Payer: Self-pay | Admitting: *Deleted

## 2022-11-05 ENCOUNTER — Encounter: Payer: Self-pay | Admitting: Neurology

## 2022-11-05 DIAGNOSIS — G43709 Chronic migraine without aura, not intractable, without status migrainosus: Secondary | ICD-10-CM

## 2022-11-05 MED ORDER — BOTOX 100 UNITS IJ SOLR: INTRAMUSCULAR | 1 refills | Status: DC

## 2022-11-05 NOTE — Telephone Encounter (Signed)
Noted  

## 2022-11-08 ENCOUNTER — Encounter: Payer: Self-pay | Admitting: Neurology

## 2022-11-11 ENCOUNTER — Encounter: Payer: Self-pay | Admitting: *Deleted

## 2022-11-12 ENCOUNTER — Ambulatory Visit (INDEPENDENT_AMBULATORY_CARE_PROVIDER_SITE_OTHER): Payer: 59 | Admitting: Neurology

## 2022-11-12 DIAGNOSIS — G43709 Chronic migraine without aura, not intractable, without status migrainosus: Secondary | ICD-10-CM

## 2022-11-12 MED ORDER — ONABOTULINUMTOXINA 100 UNITS IJ SOLR
155.0000 [IU] | Freq: Once | INTRAMUSCULAR | Status: AC
  Administered 2022-11-12: 155 [IU] via INTRAMUSCULAR

## 2022-11-12 NOTE — Progress Notes (Signed)
11/12/2022:  Stable 08/15/2022 stable and still doing great; Excellent response, > 80% mprovement in migraine freq and severity. 05/13/2022: Stable 02/12/2022: Only 4 migraines since last botox, nurtec helps 08/27/2021: Excellent response, > 80% mprovement in migraine freq and severity. She left [facility] after they changed to increase workload, with many and dr tower   Consent Form Botulism Toxin Injection For Chronic Migraine    Reviewed orally with patient, additionally signature is on file:  Botulism toxin has been approved by the Federal drug administration for treatment of chronic migraine. Botulism toxin does not cure chronic migraine and it may not be effective in some patients.  The administration of botulism toxin is accomplished by injecting a small amount of toxin into the muscles of the neck and head. Dosage must be titrated for each individual. Any benefits resulting from botulism toxin tend to wear off after 3 months with a repeat injection required if benefit is to be maintained. Injections are usually done every 3-4 months with maximum effect peak achieved by about 2 or 3 weeks. Botulism toxin is expensive and you should be sure of what costs you will incur resulting from the injection.  The side effects of botulism toxin use for chronic migraine may include:   -Transient, and usually mild, facial weakness with facial injections  -Transient, and usually mild, head or neck weakness with head/neck injections  -Reduction or loss of forehead facial animation due to forehead muscle weakness  -Eyelid drooping  -Dry eye  -Pain at the site of injection or bruising at the site of injection  -Double vision  -Potential unknown long term risks  Contraindications: You should not have Botox if you are pregnant, nursing, allergic to albumin, have an infection, skin condition, or muscle weakness at the site of the injection, or have myasthenia gravis, Lambert-Eaton syndrome, or  ALS.  It is also possible that as with any injection, there may be an allergic reaction or no effect from the medication. Reduced effectiveness after repeated injections is sometimes seen and rarely infection at the injection site may occur. All care will be taken to prevent these side effects. If therapy is given over a long time, atrophy and wasting in the muscle injected may occur. Occasionally the patient's become refractory to treatment because they develop antibodies to the toxin. In this event, therapy needs to be modified.  I have read the above information and consent to the administration of botulism toxin.    BOTOX PROCEDURE NOTE FOR MIGRAINE HEADACHE    Contraindications and precautions discussed with patient(above). Aseptic procedure was observed and patient tolerated procedure. Procedure performed by Dr. Artemio Aly  The condition has existed for more than 6 months, and pt does not have a diagnosis of ALS, Myasthenia Gravis or Lambert-Eaton Syndrome.  Risks and benefits of injections discussed and pt agrees to proceed with the procedure.  Written consent obtained  These injections are medically necessary. Pt  receives good benefits from these injections. These injections do not cause sedations or hallucinations which the oral therapies may cause.  Description of procedure:  The patient was placed in a sitting position. The standard protocol was used for Botox as follows, with 5 units of Botox injected at each site:   -Procerus muscle, midline injection  -Corrugator muscle, bilateral injection  -Frontalis muscle, bilateral injection, with 2 sites each side, medial injection was performed in the upper one third of the frontalis muscle, in the region vertical from the medial inferior edge of the  superior orbital rim. The lateral injection was again in the upper one third of the forehead vertically above the lateral limbus of the cornea, 1.5 cm lateral to the medial injection  site.  -Temporalis muscle injection, 4 sites, bilaterally. The first injection was 3 cm above the tragus of the ear, second injection site was 1.5 cm to 3 cm up from the first injection site in line with the tragus of the ear. The third injection site was 1.5-3 cm forward between the first 2 injection sites. The fourth injection site was 1.5 cm posterior to the second injection site.   -Occipitalis muscle injection, 3 sites, bilaterally. The first injection was done one half way between the occipital protuberance and the tip of the mastoid process behind the ear. The second injection site was done lateral and superior to the first, 1 fingerbreadth from the first injection. The third injection site was 1 fingerbreadth superiorly and medially from the first injection site.  -Cervical paraspinal muscle injection, 2 sites, bilateral knee first injection site was 1 cm from the midline of the cervical spine, 3 cm inferior to the lower border of the occipital protuberance. The second injection site was 1.5 cm superiorly and laterally to the first injection site.  -Trapezius muscle injection was performed at 3 sites, bilaterally. The first injection site was in the upper trapezius muscle halfway between the inflection point of the neck, and the acromion. The second injection site was one half way between the acromion and the first injection site. The third injection was done between the first injection site and the inflection point of the neck.   Will return for repeat injection in 3 months.   155 units of Botox was used, 45 Botox not injected was wasted. The patient tolerated the procedure well, there were no complications of the above procedure.

## 2022-11-12 NOTE — Progress Notes (Signed)
Botox- 100 units x 2 vial Lot: [id number]   Expiration: 08/2024 NDC: 4696-2952-84   Bacteriostatic 0.9% Sodium Chloride- 4mL total [id number] Expiration: 06/03/23 NDC: 7253-6644-03   Dx: K74.259 SP  Witnessed: Alveria Apley

## 2023-01-06 ENCOUNTER — Telehealth: Payer: Self-pay | Admitting: Neurology

## 2023-01-06 NOTE — Telephone Encounter (Signed)
Submitted new auth request via [facility] portal, received instant approval.  Auth#: [id number] (01/06/23-01/06/24)

## 2023-02-03 ENCOUNTER — Encounter: Payer: Self-pay | Admitting: Neurology

## 2023-02-05 ENCOUNTER — Ambulatory Visit: Payer: 59 | Admitting: Neurology

## 2023-02-05 DIAGNOSIS — G43709 Chronic migraine without aura, not intractable, without status migrainosus: Secondary | ICD-10-CM

## 2023-02-05 MED ORDER — ONABOTULINUMTOXINA 100 UNITS IJ SOLR
155.0000 [IU] | Freq: Once | INTRAMUSCULAR | Status: AC
  Administered 2023-02-05: 155 [IU] via INTRAMUSCULAR

## 2023-02-05 NOTE — Progress Notes (Signed)
Botox- 100 units x 2 vial Lot: [id number] Expiration: 05/2025 NDC: 5409-8119-14  Bacteriostatic 0.9% Sodium Chloride- 4 mL  Lot: [id number] Expiration: 06/03/2023 NDC: 2130-8657-84  Dx: O96.295 S/P Witnessed by Leeann Must RN

## 2023-02-05 NOTE — Progress Notes (Signed)
02/05/2023 stable and still doing great; Excellent response, > 80% mprovement in migraine freq and severity. 11/12/2022:  Stable 08/15/2022 stable and still doing great; Excellent response, > 80% mprovement in migraine freq and severity. 05/13/2022: Stable 02/12/2022: Only 4 migraines since last botox, nurtec helps 08/27/2021: Excellent response, > 80% mprovement in migraine freq and severity. She left [facility] after they changed to increase workload, with many and dr tower   Consent Form Botulism Toxin Injection For Chronic Migraine    Reviewed orally with patient, additionally signature is on file:  Botulism toxin has been approved by the Federal drug administration for treatment of chronic migraine. Botulism toxin does not cure chronic migraine and it may not be effective in some patients.  The administration of botulism toxin is accomplished by injecting a small amount of toxin into the muscles of the neck and head. Dosage must be titrated for each individual. Any benefits resulting from botulism toxin tend to wear off after 3 months with a repeat injection required if benefit is to be maintained. Injections are usually done every 3-4 months with maximum effect peak achieved by about 2 or 3 weeks. Botulism toxin is expensive and you should be sure of what costs you will incur resulting from the injection.  The side effects of botulism toxin use for chronic migraine may include:   -Transient, and usually mild, facial weakness with facial injections  -Transient, and usually mild, head or neck weakness with head/neck injections  -Reduction or loss of forehead facial animation due to forehead muscle weakness  -Eyelid drooping  -Dry eye  -Pain at the site of injection or bruising at the site of injection  -Double vision  -Potential unknown long term risks  Contraindications: You should not have Botox if you are pregnant, nursing, allergic to albumin, have an infection, skin  condition, or muscle weakness at the site of the injection, or have myasthenia gravis, Lambert-Eaton syndrome, or ALS.  It is also possible that as with any injection, there may be an allergic reaction or no effect from the medication. Reduced effectiveness after repeated injections is sometimes seen and rarely infection at the injection site may occur. All care will be taken to prevent these side effects. If therapy is given over a long time, atrophy and wasting in the muscle injected may occur. Occasionally the patient's become refractory to treatment because they develop antibodies to the toxin. In this event, therapy needs to be modified.  I have read the above information and consent to the administration of botulism toxin.    BOTOX PROCEDURE NOTE FOR MIGRAINE HEADACHE    Contraindications and precautions discussed with patient(above). Aseptic procedure was observed and patient tolerated procedure. Procedure performed by Dr. Artemio Aly  The condition has existed for more than 6 months, and pt does not have a diagnosis of ALS, Myasthenia Gravis or Lambert-Eaton Syndrome.  Risks and benefits of injections discussed and pt agrees to proceed with the procedure.  Written consent obtained  These injections are medically necessary. Pt  receives good benefits from these injections. These injections do not cause sedations or hallucinations which the oral therapies may cause.  Description of procedure:  The patient was placed in a sitting position. The standard protocol was used for Botox as follows, with 5 units of Botox injected at each site:   -Procerus muscle, midline injection  -Corrugator muscle, bilateral injection  -Frontalis muscle, bilateral injection, with 2 sites each side, medial injection was performed in the upper one  third of the frontalis muscle, in the region vertical from the medial inferior edge of the superior orbital rim. The lateral injection was again in the upper one  third of the forehead vertically above the lateral limbus of the cornea, 1.5 cm lateral to the medial injection site.  -Temporalis muscle injection, 4 sites, bilaterally. The first injection was 3 cm above the tragus of the ear, second injection site was 1.5 cm to 3 cm up from the first injection site in line with the tragus of the ear. The third injection site was 1.5-3 cm forward between the first 2 injection sites. The fourth injection site was 1.5 cm posterior to the second injection site.   -Occipitalis muscle injection, 3 sites, bilaterally. The first injection was done one half way between the occipital protuberance and the tip of the mastoid process behind the ear. The second injection site was done lateral and superior to the first, 1 fingerbreadth from the first injection. The third injection site was 1 fingerbreadth superiorly and medially from the first injection site.  -Cervical paraspinal muscle injection, 2 sites, bilateral knee first injection site was 1 cm from the midline of the cervical spine, 3 cm inferior to the lower border of the occipital protuberance. The second injection site was 1.5 cm superiorly and laterally to the first injection site.  -Trapezius muscle injection was performed at 3 sites, bilaterally. The first injection site was in the upper trapezius muscle halfway between the inflection point of the neck, and the acromion. The second injection site was one half way between the acromion and the first injection site. The third injection was done between the first injection site and the inflection point of the neck.   Will return for repeat injection in 3 months.   155 units of Botox was used, 45 Botox not injected was wasted. The patient tolerated the procedure well, there were no complications of the above procedure.

## 2023-02-10 ENCOUNTER — Ambulatory Visit: Payer: 59 | Admitting: Podiatry

## 2023-02-10 DIAGNOSIS — L600 Ingrowing nail: Secondary | ICD-10-CM | POA: Diagnosis not present

## 2023-02-10 NOTE — Progress Notes (Signed)
Chief Complaint  Patient presents with   Nail Problem    Pt presents for bil ingrown toenails states it hurts to the touch.    Subjective: Patient presents today for evaluation of pain to the medial and lateral border of the right great toe. Patient is concerned for possible ingrown nail.  It is very sensitive to touch.  Patient presents today for further treatment and evaluation.  Past Medical History:  Diagnosis Date   Anxiety    occasional per pt on 12/27/21   COVID-19 04/26/2021   Diabetes mellitus without complication (HCC)    Type 2, 08/19/21 A1c 9.2 in Epic, Follows w/ Vincent Gros, NP, LOV 10/26/21 in Epic.   GERD (gastroesophageal reflux disease)    Gout 12/2020   Pt denies   Migraines    Currently receiving Botox injections for migraines. Last Botox on 11/20/21 as of 12/25/21. Follws with Dr. Lucia Gaskins at [facility].   Neck pain    PONV (postoperative nausea and vomiting)    Wears contact lenses    Wears glasses     Past Surgical History:  Procedure Laterality Date   APPENDECTOMY  07/2019   @ [facility]   CESAREAN SECTION  2007   COLONOSCOPY  01/04/2021   COLONOSCOPY WITH ESOPHAGOGASTRODUODENOSCOPY (EGD)  08/12/2007   DILATION AND CURETTAGE OF UTERUS  2006   HYSTERECTOMY ABDOMINAL WITH SALPINGO-OOPHORECTOMY Bilateral 01/01/2022   Procedure: ABDOMINAL HYSTERECTOMY WITH RIGHT SALPINGECTOMY AND LEFT SALPINGO-OOPHORECTOMY;  Surgeon: Marcelle Overlie, MD;  Location: [facility];  Service: Gynecology;  Laterality: Bilateral;   lumbar back surgery     08/2022   LUMBAR LAMINECTOMY  08/02/2020   L4-L5   MELANOMA EXCISION  2008   left breast and left upper third of back   TONSILLECTOMY AND ADENOIDECTOMY  07/13/2017    Allergies  Allergen Reactions   Daptacel [Diphth-Acell Pertussis-Tetanus] Anaphylaxis   Milk-Related Compounds Itching and Nausea Only    Throat itching and lactose intolerant.    Objective:  General: Well developed,  nourished, in no acute distress, alert and oriented x3   Dermatology: Skin is warm, dry and supple bilateral.  Medial and lateral border right great toe is tender with evidence of an ingrowing nail. Pain on palpation noted to the border of the nail fold. The remaining nails appear unremarkable at this time.   Vascular: DP and PT pulses palpable.  No clinical evidence of vascular compromise  Neruologic: Grossly intact via light touch bilateral.  Musculoskeletal: No pedal deformity noted  Assesement: #1 Paronychia with ingrowing nail medial and lateral border right great toe #2 ingrowing toenail medial and lateral border of the left great toe; less symptomatic  Plan of Care:  -Patient evaluated.  -Discussed treatment alternatives and plan of care. Explained nail avulsion procedure and post procedure course to patient. -Patient opted for permanent partial nail avulsion of the ingrown portion of the nail.  -Prior to procedure, local anesthesia infiltration utilized using 3 ml of a 50:50 mixture of 2% plain lidocaine and 0.5% plain marcaine in a normal hallux block fashion and a betadine prep performed.  -Partial permanent nail avulsion with chemical matrixectomy performed using 3x30sec applications of phenol followed by alcohol flush.  -Light dressing applied.  Post care instructions provided -Return to clinic 3 weeks  *NP for acute care  Felecia Shelling, DPM [facility]  Dr. Felecia Shelling, DPM    [address]  [place], [place] [address]                Office [phone]  Fax [phone]

## 2023-02-19 ENCOUNTER — Other Ambulatory Visit (HOSPITAL_COMMUNITY): Payer: 59

## 2023-02-19 NOTE — Progress Notes (Signed)
Surgical Instructions   Your procedure is scheduled on December 27, 24. Report to [facility] Main Entrance "A" at 9:30 A.M., then check in with the Admitting office. Any questions or running late day of surgery: call [phone]  Questions prior to your surgery date: call [phone], Monday-Friday, 8am-4pm. If you experience any cold or flu symptoms such as cough, fever, chills, shortness of breath, etc. between now and your scheduled surgery, please notify us at the above number.     Remember:  Do not eat after midnight the night before your surgery  You may drink clear liquids until 8:30 the morning of your surgery.   Clear liquids allowed are: Water, Non-Citrus Juices (without pulp), Carbonated Beverages, Clear Tea (no milk, honey, etc.), Black Coffee Only (NO MILK, CREAM OR POWDERED CREAMER of any kind), and Gatorade.    Take these medicines the morning of surgery with A SIP OF WATER  ALPRAZolam (XANAX)  TIZANidine (ZANAFLEX)   One week prior to surgery, STOP taking any Aspirin (unless otherwise instructed by your surgeon) Aleve, Naproxen, Ibuprofen, Motrin, Advil, Goody's, BC's, all herbal medications, fish oil, and non-prescription vitamins.          WHAT DO I DO ABOUT MY DIABETES MEDICATION?   Do not take your glipiZIDE (GLUCOTROL) the morning of surgery.  Hold your Ozempic injection for 7 days prior to surgery.  Your last dose should be December 02/20/23.   HOW TO MANAGE YOUR DIABETES BEFORE AND AFTER SURGERY  Why is it important to control my blood sugar before and after surgery? Improving blood sugar levels before and after surgery helps healing and can limit problems. A way of improving blood sugar control is eating a healthy diet by:  Eating less sugar and carbohydrates  Increasing activity/exercise  Talking with your doctor about reaching your blood sugar goals High blood sugars (greater than 180 mg/dL) can raise your risk of infections and slow your recovery,  so you will need to focus on controlling your diabetes during the weeks before surgery. Make sure that the doctor who takes care of your diabetes knows about your planned surgery including the date and location.  How do I manage my blood sugar before surgery? Check your blood sugar at least 4 times a day, starting 2 days before surgery, to make sure that the level is not too high or low.  Check your blood sugar the morning of your surgery when you wake up and every 2 hours until you get to the Short Stay unit.  If your blood sugar is less than 70 mg/dL, you will need to treat for low blood sugar: Do not take insulin. Treat a low blood sugar (less than 70 mg/dL) with  cup of clear juice (cranberry or apple), 4 glucose tablets, OR glucose gel. Recheck blood sugar in 15 minutes after treatment (to make sure it is greater than 70 mg/dL). If your blood sugar is not greater than 70 mg/dL on recheck, call [phone] for further instructions. Report your blood sugar to the short stay nurse when you get to Short Stay.  If you are admitted to the hospital after surgery: Your blood sugar will be checked by the staff and you will probably be given insulin after surgery (instead of oral diabetes medicines) to make sure you have good blood sugar levels. The goal for blood sugar control after surgery is 80-180 mg/dL.            Do NOT Smoke (Tobacco/Vaping) for 24 hours prior  to your procedure.  If you use a CPAP at night, you may bring your mask/headgear for your overnight stay.   You will be asked to remove any contacts, glasses, piercing's, hearing aid's, dentures/partials prior to surgery. Please bring cases for these items if needed.    Patients discharged the day of surgery will not be allowed to drive home, and someone needs to stay with them for 24 hours.  SURGICAL WAITING ROOM VISITATION Patients may have no more than 2 support people in the waiting area - these visitors may rotate.   Pre-op  nurse will coordinate an appropriate time for 1 ADULT support person, who may not rotate, to accompany patient in pre-op.  Children under the age of 41 must have an adult with them who is not the patient and must remain in the main waiting area with an adult.  If the patient needs to stay at the hospital during part of their recovery, the visitor guidelines for inpatient rooms apply.  Please refer to the [facility] website for the visitor guidelines for any additional information.   If you received a COVID test during your pre-op visit  it is requested that you wear a mask when out in public, stay away from anyone that may not be feeling well and notify your surgeon if you develop symptoms. If you have been in contact with anyone that has tested positive in the last 10 days please notify you surgeon.      Pre-operative 5 CHG Bathing Instructions   You can play a key role in reducing the risk of infection after surgery. Your skin needs to be as free of germs as possible. You can reduce the number of germs on your skin by washing with CHG (chlorhexidine gluconate) soap before surgery. CHG is an antiseptic soap that kills germs and continues to kill germs even after washing.   DO NOT use if you have an allergy to chlorhexidine/CHG or antibacterial soaps. If your skin becomes reddened or irritated, stop using the CHG and notify one of our RNs at [phone].   Please shower with the CHG soap starting 4 days before surgery using the following schedule:     Please keep in mind the following:  DO NOT shave, including legs and underarms, starting the day of your first shower.   You may shave your face at any point before/day of surgery.  Place clean sheets on your bed the day you start using CHG soap. Use a clean washcloth (not used since being washed) for each shower. DO NOT sleep with pets once you start using the CHG.   CHG Shower Instructions:  Wash your face and private area with normal  soap. If you choose to wash your hair, wash first with your normal shampoo.  After you use shampoo/soap, rinse your hair and body thoroughly to remove shampoo/soap residue.  Turn the water OFF and apply about 3 tablespoons (45 ml) of CHG soap to a CLEAN washcloth.  Apply CHG soap ONLY FROM YOUR NECK DOWN TO YOUR TOES (washing for 3-5 minutes)  DO NOT use CHG soap on face, private areas, open wounds, or sores.  Pay special attention to the area where your surgery is being performed.  If you are having back surgery, having someone wash your back for you may be helpful. Wait 2 minutes after CHG soap is applied, then you may rinse off the CHG soap.  Pat dry with a clean towel  Put on clean clothes/pajamas  If you choose to wear lotion, please use ONLY the CHG-compatible lotions on the back of this paper.   Additional instructions for the day of surgery: DO NOT APPLY any lotions, deodorants, cologne, or perfumes.   Do not bring valuables to the hospital. [facility] is not responsible for any belongings/valuables. Do not wear nail polish, gel polish, artificial nails, or any other type of covering on natural nails (fingers and toes) Do not wear jewelry or makeup Put on clean/comfortable clothes.  Please brush your teeth.  Ask your nurse before applying any prescription medications to the skin.     CHG Compatible Lotions   Aveeno Moisturizing lotion  Cetaphil Moisturizing Cream  Cetaphil Moisturizing Lotion  Clairol Herbal Essence Moisturizing Lotion, Dry Skin  Clairol Herbal Essence Moisturizing Lotion, Extra Dry Skin  Clairol Herbal Essence Moisturizing Lotion, Normal Skin  Curel Age Defying Therapeutic Moisturizing Lotion with Alpha Hydroxy  Curel Extreme Care Body Lotion  Curel Soothing Hands Moisturizing Hand Lotion  Curel Therapeutic Moisturizing Cream, Fragrance-Free  Curel Therapeutic Moisturizing Lotion, Fragrance-Free  Curel Therapeutic Moisturizing Lotion, Original  Formula  Eucerin Daily Replenishing Lotion  Eucerin Dry Skin Therapy Plus Alpha Hydroxy Crme  Eucerin Dry Skin Therapy Plus Alpha Hydroxy Lotion  Eucerin Original Crme  Eucerin Original Lotion  Eucerin Plus Crme Eucerin Plus Lotion  Eucerin TriLipid Replenishing Lotion  Keri Anti-Bacterial Hand Lotion  Keri Deep Conditioning Original Lotion Dry Skin Formula Softly Scented  Keri Deep Conditioning Original Lotion, Fragrance Free Sensitive Skin Formula  Keri Lotion Fast Absorbing Fragrance Free Sensitive Skin Formula  Keri Lotion Fast Absorbing Softly Scented Dry Skin Formula  Keri Original Lotion  Keri Skin Renewal Lotion Keri Silky Smooth Lotion  Keri Silky Smooth Sensitive Skin Lotion  Nivea Body Creamy Conditioning Oil  Nivea Body Extra Enriched Lotion  Nivea Body Original Lotion  Nivea Body Sheer Moisturizing Lotion Nivea Crme  Nivea Skin Firming Lotion  NutraDerm 30 Skin Lotion  NutraDerm Skin Lotion  NutraDerm Therapeutic Skin Cream  NutraDerm Therapeutic Skin Lotion  ProShield Protective Hand Cream  Provon moisturizing lotion  Please read over the following fact sheets that you were given.

## 2023-02-20 ENCOUNTER — Encounter (HOSPITAL_COMMUNITY)
Admission: RE | Admit: 2023-02-20 | Discharge: 2023-02-20 | Disposition: A | Discharge status: Home / Self Care | Payer: 59 | Source: Ambulatory Visit | Attending: Neurological Surgery | Admitting: Neurological Surgery

## 2023-02-20 ENCOUNTER — Encounter (HOSPITAL_COMMUNITY): Payer: Self-pay

## 2023-02-20 ENCOUNTER — Other Ambulatory Visit: Payer: Self-pay

## 2023-02-20 VITALS — BP 151/110 | HR 98 | Temp 98.3°F | Resp 18 | Ht 63.0 in | Wt 244.3 lb

## 2023-02-20 DIAGNOSIS — Z01818 Encounter for other preprocedural examination: Secondary | ICD-10-CM | POA: Insufficient documentation

## 2023-02-20 LAB — TYPE AND SCREEN
ABO/RH(D): A POS
Antibody Screen: NEGATIVE

## 2023-02-20 LAB — SURGICAL PCR SCREEN
MRSA, PCR: NEGATIVE
Staphylococcus aureus: NEGATIVE

## 2023-02-20 NOTE — Progress Notes (Signed)
PCP - Dr. Vincent Gros Cardiologist - Denies  PPM/ICD - Denies Device Orders - n/a Rep Notified - n/a  Chest x-ray - n/a EKG - 02/20/23 Stress Test - denies ECHO - denies Cardiac Cath - denies  Sleep Study - denies CPAP - n/a  Fasting Blood Sugar - 95-109 Checks Blood Sugar _1____ times a day  Last dose of GLP1 agonist-  Ozempic last dose is 12/16 GLP1 instructions: takes on Monday will hold till after surgery Patient no longer takes metformin  Blood Thinner Instructions: denies Aspirin Instructions: denies taking any  ERAS Protcol - eras PRE-SURGERY Ensure or G2- n  COVID TEST- n   Anesthesia review: y, was on steroids that have caused her A1C to be elevated however it has come down from 10.3 to 8. New EKG,   Patient denies shortness of breath, fever, cough and chest pain at PAT appointment. Pt denies any respiratory issues at this time.    All instructions explained to the patient, with a verbal understanding of the material. Patient agrees to go over the instructions while at home for a better understanding. Patient also instructed to self quarantine after being tested for COVID-19. The opportunity to ask questions was provided.

## 2023-02-28 ENCOUNTER — Ambulatory Visit (HOSPITAL_COMMUNITY): Payer: 59

## 2023-02-28 ENCOUNTER — Other Ambulatory Visit: Payer: Self-pay

## 2023-02-28 ENCOUNTER — Observation Stay (HOSPITAL_COMMUNITY)
Admission: RE | Admit: 2023-02-28 | Discharge: 2023-03-01 | Disposition: A | Discharge status: Home / Self Care | Payer: 59 | Source: Ambulatory Visit | Attending: Neurological Surgery | Admitting: Neurological Surgery

## 2023-02-28 ENCOUNTER — Encounter (HOSPITAL_COMMUNITY)
Admission: RE | Disposition: A | Discharge status: Home / Self Care | Payer: Self-pay | Source: Ambulatory Visit | Attending: Neurological Surgery

## 2023-02-28 ENCOUNTER — Encounter (HOSPITAL_COMMUNITY): Payer: Self-pay | Admitting: Neurological Surgery

## 2023-02-28 ENCOUNTER — Ambulatory Visit (HOSPITAL_BASED_OUTPATIENT_CLINIC_OR_DEPARTMENT_OTHER): Payer: 59 | Admitting: Certified Registered Nurse Anesthetist

## 2023-02-28 ENCOUNTER — Ambulatory Visit (HOSPITAL_COMMUNITY): Payer: 59 | Admitting: Physician Assistant

## 2023-02-28 DIAGNOSIS — E119 Type 2 diabetes mellitus without complications: Secondary | ICD-10-CM | POA: Insufficient documentation

## 2023-02-28 DIAGNOSIS — M5116 Intervertebral disc disorders with radiculopathy, lumbar region: Principal | ICD-10-CM | POA: Insufficient documentation

## 2023-02-28 DIAGNOSIS — Z8616 Personal history of COVID-19: Secondary | ICD-10-CM | POA: Diagnosis not present

## 2023-02-28 DIAGNOSIS — Z01818 Encounter for other preprocedural examination: Secondary | ICD-10-CM

## 2023-02-28 DIAGNOSIS — M48061 Spinal stenosis, lumbar region without neurogenic claudication: Secondary | ICD-10-CM | POA: Diagnosis not present

## 2023-02-28 DIAGNOSIS — M5416 Radiculopathy, lumbar region: Secondary | ICD-10-CM | POA: Diagnosis not present

## 2023-02-28 LAB — CBC
HCT: 46.2 % — ABNORMAL HIGH (ref 36.0–46.0)
Hemoglobin: 14.9 g/dL (ref 12.0–15.0)
MCH: 29.6 pg (ref 26.0–34.0)
MCHC: 32.3 g/dL (ref 30.0–36.0)
MCV: 91.8 fL (ref 80.0–100.0)
Platelets: 282 10*3/uL (ref 150–400)
RBC: 5.03 MIL/uL (ref 3.87–5.11)
RDW: 12.9 % (ref 11.5–15.5)
WBC: 14.6 10*3/uL — ABNORMAL HIGH (ref 4.0–10.5)
nRBC: 0 % (ref 0.0–0.2)

## 2023-02-28 LAB — GLUCOSE, CAPILLARY
Glucose-Capillary: 138 mg/dL — ABNORMAL HIGH (ref 70–99)
Glucose-Capillary: 120 mg/dL — ABNORMAL HIGH (ref 70–99)
Glucose-Capillary: 177 mg/dL — ABNORMAL HIGH (ref 70–99)

## 2023-02-28 LAB — CREATININE, SERUM
Creatinine, Ser: 0.85 mg/dL (ref 0.44–1.00)
GFR, Estimated: >60 mL/min (ref >60)

## 2023-02-28 SURGERY — POSTERIOR LUMBAR FUSION 1 LEVEL
Anesthesia: General | Site: Back

## 2023-02-28 MED ORDER — ONDANSETRON HCL 4 MG/2ML IJ SOLN
INTRAMUSCULAR | Status: DC | PRN
  Administered 2023-02-28: 4 mg via INTRAVENOUS

## 2023-02-28 MED ORDER — METFORMIN HCL ER 500 MG PO TB24: 500.0000 mg | ORAL_TABLET | Freq: Two times a day (BID) | ORAL | Status: DC

## 2023-02-28 MED ORDER — SUGAMMADEX SODIUM 200 MG/2ML IV SOLN
INTRAVENOUS | Status: DC | PRN
  Administered 2023-02-28: 200 mg via INTRAVENOUS

## 2023-02-28 MED ORDER — SODIUM CHLORIDE 0.9% FLUSH
3.0000 mL | Freq: Two times a day (BID) | INTRAVENOUS | Status: DC
  Administered 2023-02-28 – 2023-03-01 (×2): 3 mL via INTRAVENOUS

## 2023-02-28 MED ORDER — FLEET ENEMA RE ENEM: 1.0000 | ENEMA | Freq: Once | RECTAL | Status: DC | PRN

## 2023-02-28 MED ORDER — ROCURONIUM BROMIDE 10 MG/ML (PF) SYRINGE
PREFILLED_SYRINGE | INTRAVENOUS | Status: AC
  Filled 2023-02-28: qty 10

## 2023-02-28 MED ORDER — LIDOCAINE 2% (20 MG/ML) 5 ML SYRINGE
INTRAMUSCULAR | Status: DC | PRN
  Administered 2023-02-28: 50 mg via INTRAVENOUS

## 2023-02-28 MED ORDER — CEFAZOLIN SODIUM-DEXTROSE 2-4 GM/100ML-% IV SOLN
INTRAVENOUS | Status: AC
  Filled 2023-02-28: qty 100

## 2023-02-28 MED ORDER — 0.9 % SODIUM CHLORIDE (POUR BTL) OPTIME
TOPICAL | Status: DC | PRN
  Administered 2023-02-28: 1000 mL

## 2023-02-28 MED ORDER — ACETAMINOPHEN 650 MG RE SUPP: 650.0000 mg | RECTAL | Status: DC | PRN

## 2023-02-28 MED ORDER — VITAMIN B-12 1000 MCG PO TABS
2000.0000 ug | ORAL_TABLET | Freq: Every day | ORAL | Status: DC
  Administered 2023-02-28 – 2023-03-01 (×2): 2000 ug via ORAL
  Filled 2023-02-28 (×2): qty 2

## 2023-02-28 MED ORDER — HYDROMORPHONE HCL 2 MG PO TABS
2.0000 mg | ORAL_TABLET | ORAL | Status: DC | PRN
  Administered 2023-02-28 – 2023-03-01 (×4): 2 mg via ORAL
  Filled 2023-02-28 (×4): qty 1

## 2023-02-28 MED ORDER — ACETAMINOPHEN 10 MG/ML IV SOLN
INTRAVENOUS | Status: AC
  Filled 2023-02-28: qty 100

## 2023-02-28 MED ORDER — DEXAMETHASONE SODIUM PHOSPHATE 10 MG/ML IJ SOLN
INTRAMUSCULAR | Status: DC | PRN
  Administered 2023-02-28: 6 mg via INTRAVENOUS

## 2023-02-28 MED ORDER — ENOXAPARIN SODIUM 40 MG/0.4ML IJ SOSY
40.0000 mg | PREFILLED_SYRINGE | INTRAMUSCULAR | Status: DC
  Administered 2023-03-01: 40 mg via SUBCUTANEOUS
  Filled 2023-02-28: qty 0.4

## 2023-02-28 MED ORDER — CEFAZOLIN SODIUM-DEXTROSE 2-3 GM-%(50ML) IV SOLR
INTRAVENOUS | Status: DC | PRN
  Administered 2023-02-28: 2 g via INTRAVENOUS

## 2023-02-28 MED ORDER — KETOROLAC TROMETHAMINE 15 MG/ML IJ SOLN
15.0000 mg | Freq: Four times a day (QID) | INTRAMUSCULAR | Status: AC
  Administered 2023-02-28 – 2023-03-01 (×4): 15 mg via INTRAVENOUS
  Filled 2023-02-28 (×4): qty 1

## 2023-02-28 MED ORDER — TIZANIDINE HCL 4 MG PO TABS
4.0000 mg | ORAL_TABLET | Freq: Three times a day (TID) | ORAL | Status: DC | PRN
  Administered 2023-03-01: 4 mg via ORAL
  Filled 2023-02-28: qty 1

## 2023-02-28 MED ORDER — FENTANYL CITRATE (PF) 250 MCG/5ML IJ SOLN
INTRAMUSCULAR | Status: DC | PRN
  Administered 2023-02-28 (×2): 100 ug via INTRAVENOUS
  Administered 2023-02-28: 50 ug via INTRAVENOUS

## 2023-02-28 MED ORDER — SCOPOLAMINE 1 MG/3DAYS TD PT72
MEDICATED_PATCH | TRANSDERMAL | Status: AC
  Filled 2023-02-28: qty 1

## 2023-02-28 MED ORDER — ACETAMINOPHEN 10 MG/ML IV SOLN
1000.0000 mg | Freq: Once | INTRAVENOUS | Status: DC | PRN
Start: 2023-02-28 — End: 2023-02-28
  Administered 2023-02-28: 1000 mg via INTRAVENOUS

## 2023-02-28 MED ORDER — ACETAMINOPHEN 325 MG PO TABS: 650.0000 mg | ORAL_TABLET | ORAL | Status: DC | PRN

## 2023-02-28 MED ORDER — GLIPIZIDE 10 MG PO TABS
10.0000 mg | ORAL_TABLET | Freq: Every day | ORAL | Status: DC
  Administered 2023-03-01: 10 mg via ORAL
  Filled 2023-02-28: qty 1

## 2023-02-28 MED ORDER — ORAL CARE MOUTH RINSE: 15.0000 mL | Freq: Once | OROMUCOSAL | Status: DC

## 2023-02-28 MED ORDER — SCOPOLAMINE 1 MG/3DAYS TD PT72
1.0000 | MEDICATED_PATCH | TRANSDERMAL | Status: DC
  Administered 2023-02-28: 1 via TRANSDERMAL

## 2023-02-28 MED ORDER — ROCURONIUM BROMIDE 10 MG/ML (PF) SYRINGE
PREFILLED_SYRINGE | INTRAVENOUS | Status: DC | PRN
  Administered 2023-02-28: 100 mg via INTRAVENOUS

## 2023-02-28 MED ORDER — SODIUM CHLORIDE 0.9% FLUSH: 3.0000 mL | INTRAVENOUS | Status: DC | PRN

## 2023-02-28 MED ORDER — HYDROMORPHONE HCL 1 MG/ML IJ SOLN: 0.2500 mg | INTRAMUSCULAR | Status: DC | PRN

## 2023-02-28 MED ORDER — DEXAMETHASONE SODIUM PHOSPHATE 10 MG/ML IJ SOLN
INTRAMUSCULAR | Status: AC
  Filled 2023-02-28: qty 1

## 2023-02-28 MED ORDER — CHLORHEXIDINE GLUCONATE 0.12 % MT SOLN: 15.0000 mL | Freq: Once | OROMUCOSAL | Status: AC

## 2023-02-28 MED ORDER — ACETAMINOPHEN 500 MG PO TABS: 1000.0000 mg | ORAL_TABLET | Freq: Once | ORAL | Status: DC

## 2023-02-28 MED ORDER — LIDOCAINE-EPINEPHRINE 1 %-1:100000 IJ SOLN
INTRAMUSCULAR | Status: AC
  Filled 2023-02-28: qty 1

## 2023-02-28 MED ORDER — DEXMEDETOMIDINE HCL IN NACL 80 MCG/20ML IV SOLN
INTRAVENOUS | Status: DC | PRN
  Administered 2023-02-28: 12 ug via INTRAVENOUS
  Administered 2023-02-28: 8 ug via INTRAVENOUS

## 2023-02-28 MED ORDER — LACTATED RINGERS IV SOLN: INTRAVENOUS | Status: DC

## 2023-02-28 MED ORDER — DROPERIDOL 2.5 MG/ML IJ SOLN
0.6250 mg | Freq: Once | INTRAMUSCULAR | Status: AC | PRN
  Administered 2023-02-28: 0.625 mg via INTRAVENOUS

## 2023-02-28 MED ORDER — BUPIVACAINE HCL (PF) 0.5 % IJ SOLN
INTRAMUSCULAR | Status: DC | PRN
  Administered 2023-02-28: 30 mL

## 2023-02-28 MED ORDER — SODIUM CHLORIDE 0.9 % IV SOLN: INTRAVENOUS | Status: DC

## 2023-02-28 MED ORDER — MENTHOL 3 MG MT LOZG
1.0000 | LOZENGE | OROMUCOSAL | Status: DC | PRN
Start: 2023-02-28 — End: 2023-03-01

## 2023-02-28 MED ORDER — THROMBIN 5000 UNITS EX SOLR
OROMUCOSAL | Status: DC | PRN
  Administered 2023-02-28: 5 mL via TOPICAL

## 2023-02-28 MED ORDER — MEPERIDINE HCL 25 MG/ML IJ SOLN: 6.2500 mg | INTRAMUSCULAR | Status: DC | PRN

## 2023-02-28 MED ORDER — KETOROLAC TROMETHAMINE 30 MG/ML IJ SOLN
INTRAMUSCULAR | Status: AC
  Filled 2023-02-28: qty 2

## 2023-02-28 MED ORDER — PROPOFOL 10 MG/ML IV BOLUS
INTRAVENOUS | Status: DC | PRN
  Administered 2023-02-28: 200 mg via INTRAVENOUS

## 2023-02-28 MED ORDER — ONDANSETRON HCL 4 MG/2ML IJ SOLN
INTRAMUSCULAR | Status: AC
  Filled 2023-02-28: qty 2

## 2023-02-28 MED ORDER — FENTANYL CITRATE (PF) 100 MCG/2ML IJ SOLN
INTRAMUSCULAR | Status: AC
  Filled 2023-02-28: qty 2

## 2023-02-28 MED ORDER — HYDROMORPHONE HCL 1 MG/ML IJ SOLN
0.5000 mg | INTRAMUSCULAR | Status: DC | PRN
  Administered 2023-02-28 – 2023-03-01 (×2): 0.5 mg via INTRAVENOUS
  Filled 2023-02-28 (×2): qty 0.5

## 2023-02-28 MED ORDER — DROPERIDOL 2.5 MG/ML IJ SOLN
INTRAMUSCULAR | Status: AC
  Filled 2023-02-28: qty 2

## 2023-02-28 MED ORDER — LIDOCAINE 2% (20 MG/ML) 5 ML SYRINGE
INTRAMUSCULAR | Status: AC
  Filled 2023-02-28: qty 5

## 2023-02-28 MED ORDER — ACETAMINOPHEN 325 MG PO TABS: 325.0000 mg | ORAL_TABLET | Freq: Once | ORAL | Status: DC | PRN

## 2023-02-28 MED ORDER — MIDAZOLAM HCL 2 MG/2ML IJ SOLN
INTRAMUSCULAR | Status: DC | PRN
  Administered 2023-02-28: 2 mg via INTRAVENOUS

## 2023-02-28 MED ORDER — CEFAZOLIN SODIUM-DEXTROSE 2-4 GM/100ML-% IV SOLN
2.0000 g | Freq: Three times a day (TID) | INTRAVENOUS | Status: AC
Start: 2023-02-28 — End: 2023-03-01
  Administered 2023-02-28 – 2023-03-01 (×2): 2 g via INTRAVENOUS
  Filled 2023-02-28 (×2): qty 100

## 2023-02-28 MED ORDER — SENNA 8.6 MG PO TABS
1.0000 | ORAL_TABLET | Freq: Two times a day (BID) | ORAL | Status: DC
Start: 2023-02-28 — End: 2023-03-01
  Administered 2023-02-28 – 2023-03-01 (×2): 8.6 mg via ORAL
  Filled 2023-02-28 (×2): qty 1

## 2023-02-28 MED ORDER — SODIUM CHLORIDE 0.9 % IV SOLN: 250.0000 mL | INTRAVENOUS | Status: DC

## 2023-02-28 MED ORDER — THROMBIN 5000 UNITS EX SOLR
CUTANEOUS | Status: AC
  Filled 2023-02-28: qty 5000

## 2023-02-28 MED ORDER — HYDROMORPHONE HCL 1 MG/ML IJ SOLN
INTRAMUSCULAR | Status: AC
  Filled 2023-02-28: qty 1

## 2023-02-28 MED ORDER — ONDANSETRON HCL 4 MG PO TABS: 4.0000 mg | ORAL_TABLET | Freq: Four times a day (QID) | ORAL | Status: DC | PRN

## 2023-02-28 MED ORDER — CHLORHEXIDINE GLUCONATE 0.12 % MT SOLN: 15.0000 mL | Freq: Once | OROMUCOSAL | Status: DC

## 2023-02-28 MED ORDER — VITAMIN D 25 MCG (1000 UNIT) PO TABS
5000.0000 [IU] | ORAL_TABLET | Freq: Every day | ORAL | Status: DC
  Administered 2023-02-28 – 2023-03-01 (×2): 5000 [IU] via ORAL
  Filled 2023-02-28 (×2): qty 5

## 2023-02-28 MED ORDER — PHENOL 1.4 % MT LIQD
1.0000 | OROMUCOSAL | Status: DC | PRN
Start: 2023-02-28 — End: 2023-02-28

## 2023-02-28 MED ORDER — BISACODYL 10 MG RE SUPP: 10.0000 mg | Freq: Every day | RECTAL | Status: DC | PRN

## 2023-02-28 MED ORDER — ALPRAZOLAM 0.25 MG PO TABS
0.2500 mg | ORAL_TABLET | Freq: Every evening | ORAL | Status: DC | PRN
  Administered 2023-02-28: 0.25 mg via ORAL
  Filled 2023-02-28: qty 1

## 2023-02-28 MED ORDER — BUPIVACAINE HCL (PF) 0.5 % IJ SOLN
INTRAMUSCULAR | Status: AC
  Filled 2023-02-28: qty 30

## 2023-02-28 MED ORDER — DEXMEDETOMIDINE HCL IN NACL 80 MCG/20ML IV SOLN
INTRAVENOUS | Status: AC
  Filled 2023-02-28: qty 20

## 2023-02-28 MED ORDER — LIDOCAINE-EPINEPHRINE 1 %-1:100000 IJ SOLN
INTRAMUSCULAR | Status: DC | PRN
  Administered 2023-02-28: 5 mL

## 2023-02-28 MED ORDER — FENTANYL CITRATE (PF) 250 MCG/5ML IJ SOLN
INTRAMUSCULAR | Status: AC
  Filled 2023-02-28: qty 5

## 2023-02-28 MED ORDER — ORAL CARE MOUTH RINSE: 15.0000 mL | Freq: Once | OROMUCOSAL | Status: AC

## 2023-02-28 MED ORDER — CHLORHEXIDINE GLUCONATE 0.12 % MT SOLN
OROMUCOSAL | Status: AC
  Administered 2023-02-28: 15 mL via OROMUCOSAL
  Filled 2023-02-28: qty 15

## 2023-02-28 MED ORDER — MIDAZOLAM HCL 2 MG/2ML IJ SOLN
INTRAMUSCULAR | Status: AC
  Filled 2023-02-28: qty 2

## 2023-02-28 MED ORDER — KETOROLAC TROMETHAMINE 30 MG/ML IJ SOLN
INTRAMUSCULAR | Status: DC | PRN
  Administered 2023-02-28: 30 mg via INTRAVENOUS

## 2023-02-28 MED ORDER — ONDANSETRON HCL 4 MG/2ML IJ SOLN
4.0000 mg | Freq: Four times a day (QID) | INTRAMUSCULAR | Status: DC | PRN
  Administered 2023-03-01: 4 mg via INTRAVENOUS
  Filled 2023-02-28: qty 2

## 2023-02-28 MED ORDER — ACETAMINOPHEN 160 MG/5ML PO SOLN
325.0000 mg | Freq: Once | ORAL | Status: DC | PRN
Start: 2023-02-28 — End: 2023-02-28

## 2023-02-28 SURGICAL SUPPLY — 59 items
BAG COUNTER SPONGE SURGICOUNT (BAG) ×2 IMPLANT
BASKET BONE COLLECTION (BASKET) ×2 IMPLANT
BLADE BONE MILL MEDIUM (MISCELLANEOUS) ×2 IMPLANT
BLADE CLIPPER SURG (BLADE) IMPLANT
BONE CANC CHIPS 20CC PCAN1/4 (Bone Implant) ×2 IMPLANT
BUR MATCHSTICK NEURO 3.0 LAGG (BURR) ×2 IMPLANT
CAGE COROENT LG 10X9X23-12 (Cage) IMPLANT
CANISTER SUCT 3000ML PPV (MISCELLANEOUS) ×2 IMPLANT
CHIPS CANC BONE 20CC PCAN1/4 (Bone Implant) ×2 IMPLANT
CNTNR URN SCR LID CUP LEK RST (MISCELLANEOUS) ×2 IMPLANT
COVER BACK TABLE 60X90IN (DRAPES) ×2 IMPLANT
DERMABOND ADVANCED .7 DNX12 (GAUZE/BANDAGES/DRESSINGS) ×2 IMPLANT
DEVICE DISSECT PLASMABLAD 3.0S (MISCELLANEOUS) ×2 IMPLANT
DRAPE C-ARM 42X72 X-RAY (DRAPES) ×4 IMPLANT
DRAPE HALF SHEET 40X57 (DRAPES) IMPLANT
DRAPE LAPAROTOMY 100X72X124 (DRAPES) ×2 IMPLANT
DRSG OPSITE POSTOP 4X6 (GAUZE/BANDAGES/DRESSINGS) IMPLANT
DURAPREP 26ML APPLICATOR (WOUND CARE) ×2 IMPLANT
DURASEAL APPLICATOR TIP (TIP) IMPLANT
DURASEAL SPINE SEALANT 3ML (MISCELLANEOUS) IMPLANT
ELECT REM PT RETURN 9FT ADLT (ELECTROSURGICAL) ×1
ELECTRODE REM PT RTRN 9FT ADLT (ELECTROSURGICAL) ×2 IMPLANT
GAUZE 4X4 16PLY ~~LOC~~+RFID DBL (SPONGE) IMPLANT
GAUZE SPONGE 4X4 12PLY STRL (GAUZE/BANDAGES/DRESSINGS) ×2 IMPLANT
GLOVE BIOGEL PI IND STRL 8.5 (GLOVE) ×4 IMPLANT
GLOVE ECLIPSE 8.5 STRL (GLOVE) ×4 IMPLANT
GOWN STRL REUS W/ TWL LRG LVL3 (GOWN DISPOSABLE) IMPLANT
GOWN STRL REUS W/ TWL XL LVL3 (GOWN DISPOSABLE) IMPLANT
GOWN STRL REUS W/TWL 2XL LVL3 (GOWN DISPOSABLE) ×4 IMPLANT
GRAFT BNE CANC CHIPS 1-8 20CC (Bone Implant) IMPLANT
GRAFT BONE PROTEIOS MED 2.5CC (Orthopedic Implant) IMPLANT
HEMOSTAT POWDER KIT SURGIFOAM (HEMOSTASIS) ×2 IMPLANT
KIT BASIN OR (CUSTOM PROCEDURE TRAY) ×2 IMPLANT
KIT GRAFTMAG DEL NEURO DISP (NEUROSURGERY SUPPLIES) IMPLANT
KIT TURNOVER KIT B (KITS) ×2 IMPLANT
MILL BONE PREP (MISCELLANEOUS) ×2 IMPLANT
NDL HYPO 22X1.5 SAFETY MO (MISCELLANEOUS) ×2 IMPLANT
NEEDLE HYPO 22X1.5 SAFETY MO (MISCELLANEOUS) ×1 IMPLANT
NS IRRIG 1000ML POUR BTL (IV SOLUTION) ×2 IMPLANT
PACK LAMINECTOMY NEURO (CUSTOM PROCEDURE TRAY) ×2 IMPLANT
PAD ARMBOARD 7.5X6 YLW CONV (MISCELLANEOUS) ×6 IMPLANT
PATTIES SURGICAL .5 X1 (DISPOSABLE) ×2 IMPLANT
PLASMABLADE 3.0S (MISCELLANEOUS) ×1
ROD RELINE-O LORD 5.5X40 (Rod) IMPLANT
SCREW LOCK RELINE 5.5 TULIP (Screw) IMPLANT
SCREW RELINE-O POLY 6.5X45 (Screw) IMPLANT
SPIKE FLUID TRANSFER (MISCELLANEOUS) ×2 IMPLANT
SPONGE SURGIFOAM ABS GEL 100 (HEMOSTASIS) IMPLANT
SPONGE T-LAP 4X18 ~~LOC~~+RFID (SPONGE) IMPLANT
SUT PROLENE 6 0 BV (SUTURE) IMPLANT
SUT VIC AB 1 CT1 18XBRD ANBCTR (SUTURE) ×2 IMPLANT
SUT VIC AB 2-0 CP2 18 (SUTURE) ×2 IMPLANT
SUT VIC AB 3-0 SH 8-18 (SUTURE) ×2 IMPLANT
SUT VIC AB 4-0 RB1 18 (SUTURE) ×2 IMPLANT
SYR 3ML LL SCALE MARK (SYRINGE) ×8 IMPLANT
TOWEL GREEN STERILE (TOWEL DISPOSABLE) ×2 IMPLANT
TOWEL GREEN STERILE FF (TOWEL DISPOSABLE) ×2 IMPLANT
TRAY FOLEY MTR SLVR 16FR STAT (SET/KITS/TRAYS/PACK) ×2 IMPLANT
WATER STERILE IRR 1000ML POUR (IV SOLUTION) ×2 IMPLANT

## 2023-02-28 NOTE — Transfer of Care (Signed)
Immediate Anesthesia Transfer of Care Note  Patient: Candice [name]  Procedure(s) Performed: DECOMPRESSION AND FUSION LUMBAR FOUR LUMBAR FIVE WITH PEDICLE SCREW FIXATION (Back)  Patient Location: PACU  Anesthesia Type:General  Level of Consciousness: awake, alert , and oriented  Airway & Oxygen Therapy: Patient Spontanous Breathing  Post-op Assessment: Report given to RN and Post -op Vital signs reviewed and stable  Post vital signs: Reviewed and stable  Last Vitals:  Vitals Value Taken Time  BP 116/86 02/28/23 1653  Temp    Pulse 92 02/28/23 1657  Resp 16 02/28/23 1657  SpO2 93 % 02/28/23 1657  Vitals shown include unfiled device data.  Last Pain:  Vitals:   02/28/23 0957  TempSrc:   PainSc: 7          Complications: No notable events documented.

## 2023-02-28 NOTE — Progress Notes (Signed)
   02/28/23 1800  Vitals  Temp 97.8 F (36.6 C)  Temp Source Oral  BP 134/82  MAP (mmHg) 99  BP Location Right Arm  BP Method Automatic  Patient Position (if appropriate) Lying  Pulse Rate 77  Pulse Rate Source Monitor  Resp 16  Level of Consciousness  Level of Consciousness Alert  MEWS COLOR  MEWS Score Color Green  Oxygen Therapy  SpO2 99 %  O2 Device Nasal Cannula  O2 Flow Rate (L/min) 2 L/min  Pain Assessment  Pain Scale 0-10  Pain Score 8  Pain Type Surgical pain  Pain Location Back  Pain Orientation Mid  Pain Descriptors / Indicators Aching;Discomfort;Constant  Pain Onset Gradual  Patients Stated Pain Goal 0  Pain Intervention(s) Repositioned;Emotional support;Relaxation;Rest  Complaints & Interventions  Neuro symptoms relieved by Rest  PCA/Epidural/Spinal Assessment  Respiratory Pattern Regular;Unlabored;Symmetrical  Glasgow Coma Scale  Eye Opening 4  Best Verbal Response (NON-intubated) 5  Best Motor Response 6  Glasgow Coma Scale Score 15  MEWS Score  MEWS Temp 0  MEWS Systolic 0  MEWS Pulse 0  MEWS RR 0  MEWS LOC 0  MEWS Score 0   Pt admitted from PACU with personal belongings. Pt A&Ox4, MAEx4. VSS. Honeycomb dressing to posterior back clean, dry, intact. Foley catheter removed upon admission per order with no complications. PIV clean, dry, intact. Skin assessment complete per protocol with second RN with no pressure injuries noted. Pt oriented to room and call light system. Bed in lowest position with bed alarm on and call bell within reach. Pt family notified and at bedside.

## 2023-02-28 NOTE — Anesthesia Preprocedure Evaluation (Addendum)
Anesthesia Evaluation  Patient identified by MRN, date of birth, ID band Patient awake    Reviewed: Allergy & Precautions, NPO status , Patient's Chart, lab work & pertinent test results  History of Anesthesia Complications (+) PONV and history of anesthetic complications  Airway Mallampati: I  TM Distance: >3 FB Neck ROM: Full    Dental  (+) Teeth Intact, Dental Advisory Given   Pulmonary    breath sounds clear to auscultation       Cardiovascular  Rhythm:Regular Rate:Normal     Neuro/Psych    GI/Hepatic Neg liver ROS,GERD  ,,  Endo/Other  diabetes, Type 2, Oral Hypoglycemic Agents    Renal/GU negative Renal ROS     Musculoskeletal   Abdominal   Peds  Hematology   Anesthesia Other Findings   Reproductive/Obstetrics                             Anesthesia Physical Anesthesia Plan  ASA: 3  Anesthesia Plan: General   Post-op Pain Management: Tylenol PO (pre-op)*   Induction: Intravenous  PONV Risk Score and Plan: 4 or greater and Ondansetron, Dexamethasone, Midazolam and Scopolamine patch - Pre-op  Airway Management Planned: Oral ETT  Additional Equipment: None  Intra-op Plan:   Post-operative Plan: Extubation in OR  Informed Consent: I have reviewed the patients History and Physical, chart, labs and discussed the procedure including the risks, benefits and alternatives for the proposed anesthesia with the patient or authorized representative who has indicated his/her understanding and acceptance.     Dental advisory given  Plan Discussed with: CRNA  Anesthesia Plan Comments:        Anesthesia Quick Evaluation

## 2023-02-28 NOTE — Progress Notes (Signed)
Orthopedic Tech Progress Note Patient Details:  Candice [name] [birth year] [id number]  Ortho Devices Type of Ortho Device: Lumbar corsett Ortho Device/Splint Interventions: Ordered, Application, Adjustment   Post Interventions Patient Tolerated: Well Instructions Provided: Adjustment of device, Care of device  [name] 02/28/2023, 7:41 PM

## 2023-02-28 NOTE — Interval H&P Note (Signed)
History and Physical Interval Note:  02/28/2023 12:57 PM  Candice [name]  has presented today for surgery, with the diagnosis of Radiculopathy, lumbar region.  The various methods of treatment have been discussed with the patient and family. After consideration of risks, benefits and other options for treatment, the patient has consented to  Procedure(s) with comments: PLIF - Interbody Fusion - Interbody Fusion  - L4-L5 (N/A) - C3 as a surgical intervention.  The patient's history has been reviewed, patient examined, no change in status, stable for surgery.  I have reviewed the patient's chart and labs.  Questions were answered to the patient's satisfaction.     Stefani Dama

## 2023-02-28 NOTE — Anesthesia Procedure Notes (Signed)
Procedure Name: Intubation Date/Time: 02/28/2023 1:58 PM  Performed by: Hali Marry, CRNAPre-anesthesia Checklist: Patient identified, Emergency Drugs available, Suction available and Patient being monitored Patient Re-evaluated:Patient Re-evaluated prior to induction Oxygen Delivery Method: Circle system utilized Preoxygenation: Pre-oxygenation with 100% oxygen Induction Type: IV induction Ventilation: Mask ventilation without difficulty Laryngoscope Size: Mac and 3 Grade View: Grade I Tube type: Oral Number of attempts: 1 Airway Equipment and Method: Stylet and Oral airway Placement Confirmation: ETT inserted through vocal cords under direct vision, positive ETCO2 and breath sounds checked- equal and bilateral Tube secured with: Tape Dental Injury: Teeth and Oropharynx as per pre-operative assessment

## 2023-02-28 NOTE — Plan of Care (Signed)
  Problem: Education: Goal: Ability to verbalize activity precautions or restrictions will improve Outcome: Progressing   Problem: Activity: Goal: Ability to tolerate increased activity will improve Outcome: Progressing Goal: Will remain free from falls Outcome: Progressing

## 2023-02-28 NOTE — Op Note (Signed)
Date of surgery.  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  Right foot drop. Postoperative diagnosis: Same Procedure: Bilateral laminectomies L4-L5 with decompression of the L4 and L5 nerve roots with more work than required for simple interbody technique.  Posterior lumbar interbody arthrodesis with peek spacers local autograft allograft and Proteus L4-L5.  Pedicle screw fixation L4-L5 with fluoroscopic guidance posterolateral arthrodesis with local autograft allograft and Proteus. Surgeon: Barnett Abu First Assistant: Julio Sicks, MD Anesthesia: General endotracheal Indications: Candice [name] is a 47 year old individual whose had a previous microdiscectomy at L4-5 on the right.  This was done after she rather suddenly developed a foot drop on the right side from a herniated nucleus pulposus that she had.  It was initially felt that the radicular pain was improving but then suddenly the foot got weak.  She underwent surgical decompression but this did not help her resolve the foot drop and she has had chronic recurring pain.  More recent MRI demonstrates that she has more profound degenerative changes with broad-based bulging of the disc not only on the right side but also on the left side where she started to have some radicular symptoms in a similar distribution she was advised regarding the need for surgical decompression but also stabilization of the L4-5 joint.  She is now admitted for that.  Procedure: The patient was brought to the operating room supine on the stretcher.  After the smooth induction of general tracheal anesthesia, she was carefully turned prone.  The back was prepped with alcohol DuraPrep and draped in a sterile fashion.  An elliptical incision was made around her previous scar and this was excised.  Dissection was carried down to the lumbodorsal fascia which was opened on either side midline to afford a  subperiosteal dissection down to the interlaminar space at L4-L5.  L4-L5 was identified positively with the radiograph.  Then on the right side where there was a previous laminotomy the hemilamina of L4 was resected down to the entirety of the facet joint at L4-5.  The thickened redundant ligament in this area and scar tissue adherent to the dura was carefully dissected.  In the lateral gutter was felt that there might be a conjoined nerve root.  At this point the contralateral dissection was performed were no chondrin nerve root was noted and the landmarks were identified for the edge of the dura of the edge of the disc space.  Then by carefully reexamining the decompression I was able to mobilize the common dural tube and decompress a significant subligamentous disc material in the lateral gutter.  This allowed freeing of the L5 nerve root inferiorly and identification of the L4 nerve root superiorly to protect it.  The discectomy was then completed at L4-5 bilaterally using a combination of curettes and rongeurs and a large toothed curette to decorticate the endplates at L4 and L5.  A very thorough discectomy was performed.  In the end the interspace was sized and was felt that a 10 x 9 x 23 mm spacer with 12 degrees lordosis would fit best to the spacers were packed with the combination of autograft allograft and Proteus which had been procured from the laminar shavings and laminar bone.  A total of 12 cc of bone graft was packed into the interspace along with the 2 cages.  Then pedicle entry sites were chosen at L4 and L5.  During this time the lateral gutters were decorticated and an additional 20 cc of  allograft was then procured and the lateral gutters were filled with half of this volume on each side.  Pedicle entry sites were then chosen at L4 and L5 under fluoroscopic guidance and 6.5 x 45 mm screws were placed and here is during this time that Dr. Dutch Quint helped with placement of the pedicle screws and  checking the position make sure there was no evidence of any cut out.  He also help with packing the lateral gutters with bone graft.  Inspected the interspace and make sure that the decompression of the L5 and L4 nerve roots was adequate as we had had worked on this together earlier.  With that the pedicle screws were placed 40 mm precontoured rods were placed between the pedicle screws at L4 and L5 the caps were applied in a neutral construct.  Then final radiographs were obtained in AP and lateral projection identifying good decompression and restoration of height to the disc space maintenance of a good lordosis and good fixation overall with good coronal alignment.  Once all this was verified the area was carefully checked for hemostasis the pads of the L4 and L5 nerve roots were reinspected make sure that there were decompressed and when verified the lumbodorsal fascia was reapproximated with #1 Vicryl this was after a 25 cc of half percent Marcaine was injected into the paraspinous fascia.  2-0 Vicryl was then used in the subcutaneous tissues 3-0 and 4-0 Vicryl in a subcuticular closure Dermabond was placed on the skin.  Blood loss for the procedure was estimated 375 cc and 125 cc of Cell Saver blood was returned to the patient.

## 2023-02-28 NOTE — H&P (Signed)
Candice [name] is an 47 y.o. female.   Chief Complaint: Low back and bilateral leg pain with foot drop on right. HPI: Candice [name] is a 47 year old individual whose had a herniated nucleus pulposus at L4-5 on the right.  She seems to be resolving this process conservatively but then rather suddenly developed a foot drop on the right side she underwent surgical decompression but this yielded no improvement in the foot drop.  Pain was eased for a period of time she has had some chronic back pain and lately she notes that she is now getting radicular symptoms on the left side a follow-up MRI performed a month ago demonstrates that she now has a broad-based disc protrusion at L4-5 off to the left side in addition to some foraminal stenosis that is worsening on the right side the disc is showing progression is degenerative process and despite efforts at a steroid injection and considerable efforts at physical therapy and conservative management she finds that the symptoms are progressively worsening I advised that ultimately Candice [name] would require surgical decompression and stabilization of the L4-5 joint.  She is admitted for that now  Past Medical History:  Diagnosis Date   Anxiety    occasional per pt on 12/27/21   COVID-19 04/26/2021   Diabetes mellitus without complication (HCC)    Type 2, 08/19/21 A1c 9.2 in Epic, Follows w/ Vincent Gros, NP, LOV 10/26/21 in Epic.   GERD (gastroesophageal reflux disease)    Gout 12/2020   Pt denies   Migraines    Currently receiving Botox injections for migraines. Last Botox on 11/20/21 as of 12/25/21. Follws with Dr. Lucia Gaskins at [facility].   Neck pain    PONV (postoperative nausea and vomiting)    Wears contact lenses    Wears glasses     Past Surgical History:  Procedure Laterality Date   APPENDECTOMY  07/2019   @ [facility]   CESAREAN SECTION  2007   COLONOSCOPY  01/04/2021   COLONOSCOPY WITH ESOPHAGOGASTRODUODENOSCOPY (EGD)  08/12/2007    DILATION AND CURETTAGE OF UTERUS  2006   HYSTERECTOMY ABDOMINAL WITH SALPINGO-OOPHORECTOMY Bilateral 01/01/2022   Procedure: ABDOMINAL HYSTERECTOMY WITH RIGHT SALPINGECTOMY AND LEFT SALPINGO-OOPHORECTOMY;  Surgeon: Marcelle Overlie, MD;  Location: [facility];  Service: Gynecology;  Laterality: Bilateral;   lumbar back surgery     08/2022   LUMBAR LAMINECTOMY  08/02/2020   L4-L5   MELANOMA EXCISION  2008   left breast and left upper third of back   TONSILLECTOMY AND ADENOIDECTOMY  07/13/2017    Family History  Problem Relation Age of Onset   Arthritis Mother    Diabetes Mother    Headache Mother    Colon polyps Mother    Hypertension Father    Diabetes Father    Breast cancer Maternal Aunt    Heart disease Maternal Grandfather    Heart attack Maternal Grandfather    Melanoma Paternal Grandmother    Lung cancer Paternal Grandfather    Migraines Neg Hx    Colon cancer Neg Hx    Esophageal cancer Neg Hx    Stomach cancer Neg Hx    Rectal cancer Neg Hx    Social History:  reports that she has never smoked. She has never used smokeless tobacco. She reports that she does not drink alcohol and does not use drugs.  Allergies:  Allergies  Allergen Reactions   Daptacel [Diphth-Acell Pertussis-Tetanus] Anaphylaxis   Polyethylene Glycol 2000 Dimyristoyl Glycerol Anaphylaxis, Hives, Hypertension, Nausea And  Vomiting, Palpitations and Shortness Of Breath   Soy Allergy (Do Not Select) Anaphylaxis   Soybean Oil Anaphylaxis   Milk-Related Compounds Itching and Nausea Only    Throat itching and lactose intolerant.   Pollen Extract Swelling    Other Reaction(s): Other (See Comments)  Other reaction(s): Eye Swelling   Egg Solids, Whole     Other Reaction(s): Not available, Other (See Comments)   Influenza Vaccines     Other Reaction(s): Other (See Comments)   Other Other (See Comments)    Peg 400-Propylene Glycol   Polyethylene Glycol     Other Reaction(s): Not  available, Other (See Comments)   Oxycodone Itching    No medications prior to admission.    No results found for this or any previous visit (from the past 48 hours). No results found.  Review of Systems  Constitutional:  Positive for activity change.  Musculoskeletal:  Positive for back pain and gait problem.  Neurological:  Positive for weakness and numbness.  All other systems reviewed and are negative.   Last menstrual period 12/14/2021. Physical Exam Constitutional:      Appearance: Normal appearance.  HENT:     Head: Normocephalic and atraumatic.     Right Ear: Tympanic membrane, ear canal and external ear normal.     Left Ear: Tympanic membrane, ear canal and external ear normal.     Nose: Nose normal.     Mouth/Throat:     Mouth: Mucous membranes are dry.     Pharynx: Oropharynx is clear.  Eyes:     Extraocular Movements: Extraocular movements intact.     Conjunctiva/sclera: Conjunctivae normal.     Pupils: Pupils are equal, round, and reactive to light.  Cardiovascular:     Rate and Rhythm: Normal rate and regular rhythm.     Pulses: Normal pulses.     Heart sounds: Normal heart sounds.  Pulmonary:     Effort: Pulmonary effort is normal.     Breath sounds: Normal breath sounds.  Abdominal:     General: Abdomen is flat. Bowel sounds are normal.     Palpations: Abdomen is soft.  Musculoskeletal:     Cervical back: Normal range of motion and neck supple.  Skin:    General: Skin is warm and dry.     Capillary Refill: Capillary refill takes less than 2 seconds.  Neurological:     General: No focal deficit present.     Mental Status: She is alert and oriented to person, place, and time.     Comments: Cranial nerve examination upper extremity strength and reflexes are intact.  In the lower extremities she has a complete foot drop on the right foot there is trace extensor houses longus at 1 out of 5.  Left foot demonstrates that there is mild tibialis anterior  weakness at 4 out of 5.  Gastroc strength appears intact.  Reflexes are absent both Achilles 1+ in both patellae.  Sensation is diminished on the right foot on the dorsum and lateral aspect of the right leg.  Psychiatric:        Mood and Affect: Mood normal.        Behavior: Behavior normal.        Thought Content: Thought content normal.        Judgment: Judgment normal.      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  Plan: Decompression and fusion L4-L5.  Stefani Dama, MD 02/28/2023, 7:54 AM

## 2023-03-01 DIAGNOSIS — M5116 Intervertebral disc disorders with radiculopathy, lumbar region: Secondary | ICD-10-CM | POA: Diagnosis not present

## 2023-03-01 MED ORDER — HYDROMORPHONE HCL 2 MG PO TABS: 2.0000 mg | ORAL_TABLET | ORAL | 0 refills | Status: DC | PRN

## 2023-03-01 NOTE — Progress Notes (Signed)
Pt A&Ox4, MAEx4. AVS and discharge education and instructions reviewed by CN with pt and physically handed to pt, all questions answered by CN. Pt PIV removed by CN. Honeycomb dressing to posterior back clean, dry, intact. Back corset applied. Pt transported off unit via wheelchair with significant other and all personal belongings. Pt to pick up prescribed medications to preferred pharmacy on file.

## 2023-03-01 NOTE — Discharge Summary (Signed)
Physician Discharge Summary  Patient ID: Candice [name] MRN: [id number] DOB/AGE: September 27, 1975 47 y.o.  Admit date: 02/28/2023 Discharge date: 03/01/2023  Admission Diagnoses: Lumbar radiculopathy with degenerative disc disease  Discharge Diagnoses: Lumbar radiculopathy with degenerative disc disease Principal Problem:   Right lumbar radiculopathy   Discharged Condition: good  Hospital Course: Patient was admitted to undergo surgical decompression at the level of L4-L5 with posterior interbody arthrodesis.  She tolerated surgery well.  She is ambulatory.  Incision is clean and dry.  Consults: None  Significant Diagnostic Studies: None  Treatments: surgery: See op note  Discharge Exam: Blood pressure 110/69, pulse 88, temperature 98.2 F (36.8 C), temperature source Oral, resp. rate 18, height 5\' 3"  (1.6 m), weight 110.7 kg, last menstrual period 12/14/2021, SpO2 96%. Incision is clean and dry Station and gait are intact right foot drop remains little changed with slight extensor houses longus function noted.  Disposition: Discharge disposition: 01-Home or Self Care       Discharge Instructions     Call MD for:  redness, tenderness, or signs of infection (pain, swelling, redness, odor or green/yellow discharge around incision site)   Complete by: As directed    Call MD for:  severe uncontrolled pain   Complete by: As directed    Call MD for:  temperature >100.4   Complete by: As directed    Diet - low sodium heart healthy   Complete by: As directed    Discharge instructions   Complete by: As directed    Okay to shower. Do not apply salves or appointments to incision. No heavy lifting with the upper extremities greater than 10 pounds. May resume driving when not requiring pain medication and patient feels comfortable with doing so.   Incentive spirometry RT   Complete by: As directed    Increase activity slowly   Complete by: As directed       Allergies as of  03/01/2023       Reactions   Daptacel [diphth-acell Pertussis-tetanus] Anaphylaxis   Polyethylene Glycol 2000 Dimyristoyl Glycerol Anaphylaxis, Hives, Hypertension, Nausea And Vomiting, Palpitations, Shortness Of Breath   Soy Allergy (do Not Select) Anaphylaxis   Soybean Oil Anaphylaxis   Milk-related Compounds Itching, Nausea Only   Throat itching and lactose intolerant.   Pollen Extract Swelling   Other Reaction(s): Other (See Comments) Other reaction(s): Eye Swelling   Egg Solids, Whole    Other Reaction(s): Not available, Other (See Comments)   Influenza Vaccines    Other Reaction(s): Other (See Comments)   Other Other (See Comments)   Peg 400-Propylene Glycol   Polyethylene Glycol    Other Reaction(s): Not available, Other (See Comments)   Oxycodone Itching        Medication List     TAKE these medications    ALPRAZolam 0.25 MG tablet Commonly known as: XANAX Take 0.25 mg by mouth at bedtime as needed for sleep.   Botox 100 units Solr injection Generic drug: botulinum toxin Type A INJECT 155 UNITS INTO THE  MUSCLES OF THE HEAD AND NECK  EVERY 3 MONTHS   cyanocobalamin 2000 MCG tablet Take 2,000 mcg by mouth daily.   glipiZIDE 10 MG tablet Commonly known as: GLUCOTROL Take 10 mg by mouth daily before breakfast.   HYDROmorphone 2 MG tablet Commonly known as: DILAUDID Take 1 tablet (2 mg total) by mouth every 3 (three) hours as needed for severe pain (pain score 7-10).   metformin 500 MG (OSM) 24 hr tablet Commonly known  as: FORTAMET TAKE 1 TABLET BY MOUTH TWICE A DAY   Ozempic (0.25 or 0.5 MG/DOSE) 2 MG/3ML Sopn Generic drug: Semaglutide(0.25 or 0.5MG /DOS) Inject 1 mg into the skin once a week.   tiZANidine 4 MG tablet Commonly known as: ZANAFLEX Take 2 mg by mouth at bedtime as needed for muscle spasms.   tretinoin 0.05 % cream Commonly known as: RETIN-A Apply 1 application  topically every 30 (thirty) days.   Vitamin D-3 125 MCG (5000 UT)  Tabs Take 5,000 Units by mouth daily.         Signed: Stefani Dama 03/01/2023, 10:33 AM

## 2023-03-01 NOTE — Anesthesia Postprocedure Evaluation (Signed)
Anesthesia Post Note  Patient: Candice [name]  Procedure(s) Performed: DECOMPRESSION AND FUSION LUMBAR FOUR LUMBAR FIVE WITH PEDICLE SCREW FIXATION (Back)     Patient location during evaluation: PACU Anesthesia Type: General Level of consciousness: awake and alert Pain management: pain level controlled Vital Signs Assessment: post-procedure vital signs reviewed and stable Respiratory status: spontaneous breathing, nonlabored ventilation, respiratory function stable and patient connected to nasal cannula oxygen Cardiovascular status: blood pressure returned to baseline and stable Postop Assessment: no apparent nausea or vomiting Anesthetic complications: no   No notable events documented.               Shelton Silvas

## 2023-03-01 NOTE — Evaluation (Signed)
Occupational Therapy Evaluation Patient Details Name: Candice [name] MRN: [id number] DOB: 1975/06/10 Today's Date: 03/01/2023   History of Present Illness 47 yo s/p PLIF.  PMH includes prior lumbar laminectomy times 2, GERD, gout.   Clinical Impression   Patient admitted for the procedure above.  PTA she lives at home with her spouse, and despite lower back pain, remained independent with all ADL,iADL and mobility.  Education sheet issued and reviewed with good understanding.  Patient does have R foot drop, her AFO is at home.  All questions answered, and no further OT needs identified.  PT can screen the patient if needed.  Recommend follow up as prescribed by MD.         If plan is discharge home, recommend the following: Assist for transportation    Functional Status Assessment  Patient has not had a recent decline in their functional status  Equipment Recommendations  None recommended by OT    Recommendations for Other Services       Precautions / Restrictions Precautions Precautions: Back Precaution Booklet Issued: Yes (comment) Required Braces or Orthoses: Spinal Brace Spinal Brace: Lumbar corset Restrictions Weight Bearing Restrictions Per Provider Order: No      Mobility Bed Mobility Overal bed mobility: Modified Independent                  Transfers Overall transfer level: Independent                        Balance Overall balance assessment: Mild deficits observed, not formally tested                                         ADL either performed or assessed with clinical judgement   ADL Overall ADL's : At baseline                                             Vision Baseline Vision/History: 1 Wears glasses Patient Visual Report: No change from baseline       Perception Perception: Not tested       Praxis Praxis: Not tested       Pertinent Vitals/Pain Pain Assessment Pain Assessment:  Faces Faces Pain Scale: Hurts a little bit Pain Location: Incisional Pain Descriptors / Indicators: Aching Pain Intervention(s): Monitored during session     Extremity/Trunk Assessment Upper Extremity Assessment Upper Extremity Assessment: Overall WFL for tasks assessed   Lower Extremity Assessment Lower Extremity Assessment: Overall WFL for tasks assessed   Cervical / Trunk Assessment Cervical / Trunk Assessment: Back Surgery   Communication Communication Communication: No apparent difficulties   Cognition Arousal: Alert Behavior During Therapy: WFL for tasks assessed/performed Overall Cognitive Status: Within Functional Limits for tasks assessed                                       General Comments   VSS on RA    Exercises     Shoulder Instructions      Home Living Family/patient expects to be discharged to:: Private residence Living Arrangements: Spouse/significant other Available Help at Discharge: Family;Available 24 hours/day Type of Home: House Home Access: Stairs to enter Entrance  Stairs-Number of Steps: 5 Entrance Stairs-Rails: Right;Left Home Layout: Two level;Able to live on main level with bedroom/bathroom     Bathroom Shower/Tub: Producer, television/film/video: Standard Bathroom Accessibility: Yes How Accessible: Accessible via walker Home Equipment: Shower seat;Grab bars - tub/shower          Prior Functioning/Environment Prior Level of Function : Independent/Modified Independent;Driving;Working/employed                        OT Problem List: Pain      OT Treatment/Interventions:      OT Goals(Current goals can be found in the care plan section) Acute Rehab OT Goals Patient Stated Goal: Return home OT Goal Formulation: With patient Time For Goal Achievement: 03/02/23 Potential to Achieve Goals: Good  OT Frequency:      Co-evaluation              AM-PAC OT "6 Clicks" Daily Activity     Outcome  Measure Help from another person eating meals?: None Help from another person taking care of personal grooming?: None Help from another person toileting, which includes using toliet, bedpan, or urinal?: None Help from another person bathing (including washing, rinsing, drying)?: None Help from another person to put on and taking off regular upper body clothing?: None Help from another person to put on and taking off regular lower body clothing?: None 6 Click Score: 24   End of Session Equipment Utilized During Treatment: Back brace Nurse Communication: Mobility status  Activity Tolerance: Patient tolerated treatment well Patient left: in chair;with call bell/phone within reach;with family/visitor present  OT Visit Diagnosis: Pain Pain - Right/Left:  (post surgical)                Time: 4098-1191 OT Time Calculation (min): 19 min Charges:  OT General Charges $OT Visit: 1 Visit OT Evaluation $OT Eval Moderate Complexity: 1 Mod  03/01/2023  RP, OTR/L  Acute Rehabilitation Services  Office:  [phone]   Suzanna Obey 03/01/2023, 8:33 AM

## 2023-03-03 ENCOUNTER — Ambulatory Visit: Payer: 59 | Admitting: Podiatry

## 2023-03-03 MED FILL — Heparin Sodium (Porcine) Inj 1000 Unit/ML: INTRAMUSCULAR | Qty: 30 | Status: AC

## 2023-03-03 MED FILL — Sodium Chloride IV Soln 0.9%: INTRAVENOUS | Qty: 2000 | Status: AC

## 2023-03-04 NOTE — Addendum Note (Signed)
Addendum  created 03/04/23 0835 by Shelton Silvas, MD   Intraprocedure Staff edited

## 2023-04-21 ENCOUNTER — Other Ambulatory Visit: Payer: Self-pay | Admitting: Neurology

## 2023-04-21 DIAGNOSIS — G43709 Chronic migraine without aura, not intractable, without status migrainosus: Secondary | ICD-10-CM

## 2023-04-22 NOTE — Telephone Encounter (Signed)
Confirmed via [employer] portal that previous auth carried over to new plan.

## 2023-04-28 ENCOUNTER — Telehealth: Payer: Self-pay | Admitting: Neurology

## 2023-04-28 NOTE — Telephone Encounter (Signed)
 [facility],[place] Schedule delivery on February 26 and signature is required.

## 2023-05-07 ENCOUNTER — Ambulatory Visit (INDEPENDENT_AMBULATORY_CARE_PROVIDER_SITE_OTHER): Payer: 59 | Admitting: Neurology

## 2023-05-07 DIAGNOSIS — G43709 Chronic migraine without aura, not intractable, without status migrainosus: Secondary | ICD-10-CM

## 2023-05-07 MED ORDER — ONABOTULINUMTOXINA 100 UNITS IJ SOLR
155.0000 [IU] | Freq: Once | INTRAMUSCULAR | Status: AC
  Administered 2023-05-07: 155 [IU] via INTRAMUSCULAR

## 2023-05-07 NOTE — Progress Notes (Signed)
 Botox- 100 units x 2 vials Lot: [id number] Expiration: 07/2025 NDC: 3664-4034-74  Bacteriostatic 0.9% Sodium Chloride- 2 mL  Lot: [id number] Expiration: 01/03/2024 NDC: 8756-4332-95  Dx: J88.416 S/P Witnessed by Lennie Muckle, RN

## 2023-05-07 NOTE — Progress Notes (Signed)
 05/07/2023: stable, still doing ecellent. Has had it with Megan in the past. She is so stable she hasn't needed much other than botox so at this time Butch Penny can provide the botox injections and she can f/u with Dr. Lucia Gaskins as needed.  02/05/2023 stable and still doing great; Excellent response, > 80% mprovement in migraine freq and severity. 11/12/2022:  Stable 08/15/2022 stable and still doing great; Excellent response, > 80% mprovement in migraine freq and severity. 05/13/2022: Stable 02/12/2022: Only 4 migraines since last botox, nurtec helps 08/27/2021: Excellent response, > 80% mprovement in migraine freq and severity. She left [facility] after they changed to increase workload, with many and dr tower   Consent Form Botulism Toxin Injection For Chronic Migraine    Reviewed orally with patient, additionally signature is on file:  Botulism toxin has been approved by the Federal drug administration for treatment of chronic migraine. Botulism toxin does not cure chronic migraine and it may not be effective in some patients.  The administration of botulism toxin is accomplished by injecting a small amount of toxin into the muscles of the neck and head. Dosage must be titrated for each individual. Any benefits resulting from botulism toxin tend to wear off after 3 months with a repeat injection required if benefit is to be maintained. Injections are usually done every 3-4 months with maximum effect peak achieved by about 2 or 3 weeks. Botulism toxin is expensive and you should be sure of what costs you will incur resulting from the injection.  The side effects of botulism toxin use for chronic migraine may include:   -Transient, and usually mild, facial weakness with facial injections  -Transient, and usually mild, head or neck weakness with head/neck injections  -Reduction or loss of forehead facial animation due to forehead muscle weakness  -Eyelid drooping  -Dry eye  -Pain  at the site of injection or bruising at the site of injection  -Double vision  -Potential unknown long term risks  Contraindications: You should not have Botox if you are pregnant, nursing, allergic to albumin, have an infection, skin condition, or muscle weakness at the site of the injection, or have myasthenia gravis, Lambert-Eaton syndrome, or ALS.  It is also possible that as with any injection, there may be an allergic reaction or no effect from the medication. Reduced effectiveness after repeated injections is sometimes seen and rarely infection at the injection site may occur. All care will be taken to prevent these side effects. If therapy is given over a long time, atrophy and wasting in the muscle injected may occur. Occasionally the patient's become refractory to treatment because they develop antibodies to the toxin. In this event, therapy needs to be modified.  I have read the above information and consent to the administration of botulism toxin.    BOTOX PROCEDURE NOTE FOR MIGRAINE HEADACHE    Contraindications and precautions discussed with patient(above). Aseptic procedure was observed and patient tolerated procedure. Procedure performed by Dr. Artemio Aly  The condition has existed for more than 6 months, and pt does not have a diagnosis of ALS, Myasthenia Gravis or Lambert-Eaton Syndrome.  Risks and benefits of injections discussed and pt agrees to proceed with the procedure.  Written consent obtained  These injections are medically necessary. Pt  receives good benefits from these injections. These injections do not cause sedations or hallucinations which the oral therapies may cause.  Description of procedure:  The patient was placed in a sitting position.  The standard protocol was used for Botox as follows, with 5 units of Botox injected at each site:   -Procerus muscle, midline injection  -Corrugator muscle, bilateral injection  -Frontalis muscle, bilateral  injection, with 2 sites each side, medial injection was performed in the upper one third of the frontalis muscle, in the region vertical from the medial inferior edge of the superior orbital rim. The lateral injection was again in the upper one third of the forehead vertically above the lateral limbus of the cornea, 1.5 cm lateral to the medial injection site.  -Temporalis muscle injection, 4 sites, bilaterally. The first injection was 3 cm above the tragus of the ear, second injection site was 1.5 cm to 3 cm up from the first injection site in line with the tragus of the ear. The third injection site was 1.5-3 cm forward between the first 2 injection sites. The fourth injection site was 1.5 cm posterior to the second injection site.   -Occipitalis muscle injection, 3 sites, bilaterally. The first injection was done one half way between the occipital protuberance and the tip of the mastoid process behind the ear. The second injection site was done lateral and superior to the first, 1 fingerbreadth from the first injection. The third injection site was 1 fingerbreadth superiorly and medially from the first injection site.  -Cervical paraspinal muscle injection, 2 sites, bilateral knee first injection site was 1 cm from the midline of the cervical spine, 3 cm inferior to the lower border of the occipital protuberance. The second injection site was 1.5 cm superiorly and laterally to the first injection site.  -Trapezius muscle injection was performed at 3 sites, bilaterally. The first injection site was in the upper trapezius muscle halfway between the inflection point of the neck, and the acromion. The second injection site was one half way between the acromion and the first injection site. The third injection was done between the first injection site and the inflection point of the neck.   Will return for repeat injection in 3 months.   155 units of Botox was used, 45 Botox not injected was wasted. The  patient tolerated the procedure well, there were no complications of the above procedure.

## 2023-05-28 ENCOUNTER — Encounter: Payer: Self-pay | Admitting: Neurology

## 2023-06-19 ENCOUNTER — Encounter: Payer: Self-pay | Admitting: Neurology

## 2023-06-23 ENCOUNTER — Telehealth: Payer: Self-pay | Admitting: Adult Health

## 2023-06-23 NOTE — Telephone Encounter (Signed)
 Pt presented new insurance plans, submitted benefit verification [id number].

## 2023-07-09 NOTE — Telephone Encounter (Signed)
 Received approval from [facility], pt will continue to fill through [facility]. Auth#: [id number] (07/09/23-10/09/23)

## 2023-07-09 NOTE — Telephone Encounter (Signed)
 Submitted auth to new [employer] plan via CMM, status is pending. Key: Candice [name] is not req for secondary [employer] plan.

## 2023-07-16 ENCOUNTER — Ambulatory Visit: Payer: Self-pay | Admitting: Podiatry

## 2023-07-25 ENCOUNTER — Encounter: Payer: Self-pay | Admitting: Neurology

## 2023-07-25 DIAGNOSIS — G43709 Chronic migraine without aura, not intractable, without status migrainosus: Secondary | ICD-10-CM

## 2023-07-29 MED ORDER — BOTOX 100 UNITS IJ SOLR
INTRAMUSCULAR | 2 refills | Status: AC
Start: 2023-07-29 — End: ?

## 2023-08-06 NOTE — Progress Notes (Unsigned)
 08/07/2023:only 2 migraines a month. Nurtec when she gets a migraine  05/07/2023: stable, still doing ecellent. Has had it with Tyreece Gelles in the past. She is so stable she hasn't needed much other than botox  so at this time Clem Currier can provide the botox  injections and she can f/u with Dr. Tresia Fruit as needed. 02/05/2023 stable and still doing great; Excellent response, > 80% mprovement in migraine freq and severity. 11/12/2022:  Stable 08/15/2022 stable and still doing great; Excellent response, > 80% mprovement in migraine freq and severity. 05/13/2022: Stable 02/12/2022: Only 4 migraines since last botox , nurtec helps 08/27/2021: Excellent response, > 80% mprovement in migraine freq and severity. She left [place] [facility] care after they changed to increase workload, with many and dr tower   BOTOX  PROCEDURE NOTE FOR MIGRAINE HEADACHE    Contraindications and precautions discussed with patient(above). Aseptic procedure was observed and patient tolerated procedure. Procedure performed by Clem Currier, NP  The condition has existed for more than 6 months, and pt does not have a diagnosis of ALS, Myasthenia Gravis or Lambert-Eaton Syndrome.  Risks and benefits of injections discussed and pt agrees to proceed with the procedure.  Written consent obtained  These injections do not cause sedations or hallucinations which the oral therapies may cause.  Indication/Diagnosis: chronic migraine BOTOX (U1324) injection was performed according to protocol by [employer]. 200 units of BOTOX  was dissolved into 4 cc NS.   NDC: [address]-66  Type of toxin: Botox     Botox - 100 units x 2 vials Lot: [id number] Expiration: 12/31/2025 NDC: 2595-6387-56   Bacteriostatic 0.9% Sodium Chloride - 4 mL  Lot: [id number] Expiration: 08/31/2024 NDC: 1884-1660-63       Description of procedure:  The patient was placed in a sitting position. The standard protocol was used for Botox  as follows, with 5 units of Botox  injected  at each site:   -Procerus muscle, midline injection  -Corrugator muscle, bilateral injection  -Frontalis muscle, bilateral injection, with 2 sites each side, medial injection was performed in the upper one third of the frontalis muscle, in the region vertical from the medial inferior edge of the superior orbital rim. The lateral injection was again in the upper one third of the forehead vertically above the lateral limbus of the cornea, 1.5 cm lateral to the medial injection site.  -Temporalis muscle injection, 4 sites, bilaterally. The first injection was 3 cm above the tragus of the ear, second injection site was 1.5 cm to 3 cm up from the first injection site in line with the tragus of the ear. The third injection site was 1.5-3 cm forward between the first 2 injection sites. The fourth injection site was 1.5 cm posterior to the second injection site.  -Occipitalis muscle injection, 3 sites, bilaterally. The first injection was done one half way between the occipital protuberance and the tip of the mastoid process behind the ear. The second injection site was done lateral and superior to the first, 1 fingerbreadth from the first injection. The third injection site was 1 fingerbreadth superiorly and medially from the first injection site.  -Cervical paraspinal muscle injection, 2 sites, bilateral knee first injection site was 1 cm from the midline of the cervical spine, 3 cm inferior to the lower border of the occipital protuberance. The second injection site was 1.5 cm superiorly and laterally to the first injection site.  -Trapezius muscle injection was performed at 3 sites, bilaterally. The first injection site was in the upper trapezius muscle halfway between the inflection point of  the neck, and the acromion. The second injection site was one half way between the acromion and the first injection site. The third injection was done between the first injection site and the inflection point of the  neck.   Will return for repeat injection in 3 months.   A 200 unit sof Botox  was used, 155 units were injected, the rest of the Botox  was wasted. The patient tolerated the procedure well, there were no complications of the above procedure.  Clem Currier, MSN, NP-C 08/06/2023, 9:39 AM [facility] [address], [address] [place], [place] [address] [phone]

## 2023-08-07 ENCOUNTER — Ambulatory Visit (INDEPENDENT_AMBULATORY_CARE_PROVIDER_SITE_OTHER): Payer: Self-pay | Admitting: Adult Health

## 2023-08-07 VITALS — HR 87

## 2023-08-07 DIAGNOSIS — G43709 Chronic migraine without aura, not intractable, without status migrainosus: Secondary | ICD-10-CM | POA: Diagnosis not present

## 2023-08-07 MED ORDER — ONABOTULINUMTOXINA 100 UNITS IJ SOLR
155.0000 [IU] | Freq: Once | INTRAMUSCULAR | Status: AC
  Administered 2023-08-07: 155 [IU] via INTRAMUSCULAR

## 2023-08-07 NOTE — Progress Notes (Signed)
 Botox - 100 units x 2 vials Lot: [id number] Expiration: 12/31/2025 NDC: 1308-6578-46  Bacteriostatic 0.9% Sodium Chloride - 4 mL  Lot: [id number] Expiration: 08/31/2024 NDC: 8413-2440-10  Dx: U72.536 S/P  Witnessed by Mora Apple, RN

## 2023-08-22 ENCOUNTER — Ambulatory Visit (INDEPENDENT_AMBULATORY_CARE_PROVIDER_SITE_OTHER)
Admit: 2023-08-22 | Discharge: 2023-08-22 | Disposition: A | Discharge status: Home / Self Care | Attending: Family Medicine | Admitting: Family Medicine

## 2023-08-22 ENCOUNTER — Encounter (HOSPITAL_BASED_OUTPATIENT_CLINIC_OR_DEPARTMENT_OTHER): Payer: Self-pay

## 2023-08-22 ENCOUNTER — Telehealth (HOSPITAL_BASED_OUTPATIENT_CLINIC_OR_DEPARTMENT_OTHER): Payer: Self-pay | Admitting: Family Medicine

## 2023-08-22 ENCOUNTER — Ambulatory Visit (HOSPITAL_BASED_OUTPATIENT_CLINIC_OR_DEPARTMENT_OTHER)
Admission: RE | Admit: 2023-08-22 | Discharge: 2023-08-22 | Disposition: A | Discharge status: Home / Self Care | Source: Ambulatory Visit | Attending: Family Medicine

## 2023-08-22 ENCOUNTER — Ambulatory Visit (HOSPITAL_BASED_OUTPATIENT_CLINIC_OR_DEPARTMENT_OTHER): Payer: Self-pay | Admitting: Family Medicine

## 2023-08-22 DIAGNOSIS — R000 Tachycardia, unspecified: Secondary | ICD-10-CM | POA: Diagnosis not present

## 2023-08-22 DIAGNOSIS — M545 Low back pain, unspecified: Secondary | ICD-10-CM

## 2023-08-22 DIAGNOSIS — R0789 Other chest pain: Secondary | ICD-10-CM | POA: Diagnosis not present

## 2023-08-22 DIAGNOSIS — M542 Cervicalgia: Secondary | ICD-10-CM

## 2023-08-22 MED ORDER — TIZANIDINE HCL 4 MG PO CAPS: 4.0000 mg | ORAL_CAPSULE | Freq: Three times a day (TID) | ORAL | 0 refills | Status: DC | PRN

## 2023-08-22 NOTE — Discharge Instructions (Signed)
 I will call when I get your results  Follow up as needed

## 2023-08-22 NOTE — Telephone Encounter (Signed)
 Results reviewed and sending in muscle relaxer for neck tightness
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
## 2023-08-24 ENCOUNTER — Telehealth (HOSPITAL_BASED_OUTPATIENT_CLINIC_OR_DEPARTMENT_OTHER): Payer: Self-pay | Admitting: Family Medicine

## 2023-08-24 ENCOUNTER — Encounter (HOSPITAL_BASED_OUTPATIENT_CLINIC_OR_DEPARTMENT_OTHER): Payer: Self-pay | Admitting: Family Medicine
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
## 2023-09-03 ENCOUNTER — Ambulatory Visit: Payer: Self-pay
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
## 2023-09-04 ENCOUNTER — Ambulatory Visit (INDEPENDENT_AMBULATORY_CARE_PROVIDER_SITE_OTHER): Admitting: Family Medicine

## 2023-09-04 VITALS — BP 160/95 | HR 88 | Ht 63.0 in | Wt 247.8 lb

## 2023-09-04 DIAGNOSIS — M546 Pain in thoracic spine: Secondary | ICD-10-CM | POA: Diagnosis not present

## 2023-09-04 DIAGNOSIS — E1165 Type 2 diabetes mellitus with hyperglycemia: Secondary | ICD-10-CM | POA: Diagnosis not present

## 2023-09-04 MED ORDER — METFORMIN HCL ER (OSM) 500 MG PO TB24: 500.0000 mg | ORAL_TABLET | Freq: Two times a day (BID) | ORAL | 1 refills | Status: DC

## 2023-09-04 NOTE — Patient Instructions (Signed)
 It was nice to see you today,  We addressed the following topics today: -I am ordering a CT scan of your chest. - Someone should call you to schedule this from [facility] imaging - I have sent in refills of your metformin .   Have a great day,  Rolan Slain, MD
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
## 2023-09-05 DIAGNOSIS — M546 Pain in thoracic spine: Secondary | ICD-10-CM | POA: Insufficient documentation
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
## 2023-09-08 ENCOUNTER — Telehealth: Admitting: Physician Assistant

## 2023-09-08 DIAGNOSIS — B379 Candidiasis, unspecified: Secondary | ICD-10-CM | POA: Diagnosis not present

## 2023-09-08 DIAGNOSIS — R3989 Other symptoms and signs involving the genitourinary system: Secondary | ICD-10-CM

## 2023-09-08 DIAGNOSIS — T3695XA Adverse effect of unspecified systemic antibiotic, initial encounter: Secondary | ICD-10-CM | POA: Diagnosis not present

## 2023-09-08 MED ORDER — FLUCONAZOLE 150 MG PO TABS: 150.0000 mg | ORAL_TABLET | ORAL | 0 refills | Status: DC | PRN

## 2023-09-08 MED ORDER — CIPROFLOXACIN HCL 500 MG PO TABS: 500.0000 mg | ORAL_TABLET | Freq: Two times a day (BID) | ORAL | 0 refills | Status: AC

## 2023-09-08 NOTE — Progress Notes (Signed)
 E-Visit for Urinary Problems  We are sorry that you are not feeling well.  Here is how we plan to help!  Based on what you shared with me it looks like you most likely have a simple urinary tract infection.  A UTI (Urinary Tract Infection) is a bacterial infection of the bladder.  Most cases of urinary tract infections are simple to treat but a key part of your care is to encourage you to drink plenty of fluids and watch your symptoms carefully.  I have prescribed Ciprofloxacin  500mg  Take 1 tablet twice daily for 7 days.  Your symptoms should gradually improve. Call us  if the burning in your urine worsens, you develop worsening fever, back pain or pelvic pain or if your symptoms do not resolve after completing the antibiotic.  Diflucan  given as prophylaxis as patient tends to get vaginal yeast infections with antibiotic use.  Urinary tract infections can be prevented by drinking plenty of water to keep your body hydrated.  Also be sure when you wipe, wipe from front to back and don't hold it in!  If possible, empty your bladder every 4 hours.  HOME CARE Drink plenty of fluids Compete the full course of the antibiotics even if the symptoms resolve Remember, when you need to go.go. Holding in your urine can increase the likelihood of getting a UTI! GET HELP RIGHT AWAY IF: You cannot urinate You get a high fever Worsening back pain occurs You see blood in your urine You feel sick to your stomach or throw up You feel like you are going to pass out  MAKE SURE YOU  Understand these instructions. Will watch your condition. Will get help right away if you are not doing well or get worse.   Thank you for choosing an e-visit.  Your e-visit answers were reviewed by a board certified advanced clinical practitioner to complete your personal care plan. Depending upon the condition, your plan could have included both over the counter or prescription medications.  Please review your pharmacy  choice. Make sure the pharmacy is open so you can pick up prescription now. If there is a problem, you may contact your provider through [employer] and have the prescription routed to another pharmacy.  Your safety is important to us . If you have drug allergies check your prescription carefully.   For the next 24 hours you can use MyChart to ask questions about today's visit, request a non-urgent call back, or ask for a work or school excuse. You will get an email in the next two days asking about your experience. I hope that your e-visit has been valuable and will speed your recovery.   I have spent 5 minutes in review of e-visit questionnaire, review and updating patient chart, medical decision making and response to patient.   Delon CHRISTELLA Dickinson, PA-C

## 2023-09-12 ENCOUNTER — Ambulatory Visit
Admission: RE | Admit: 2023-09-12 | Discharge: 2023-09-12 | Disposition: A | Discharge status: Home / Self Care | Source: Ambulatory Visit | Attending: Family Medicine | Admitting: Family Medicine

## 2023-09-12 DIAGNOSIS — M546 Pain in thoracic spine: Secondary | ICD-10-CM

## 2023-09-15 ENCOUNTER — Ambulatory Visit: Payer: Self-pay | Admitting: Family Medicine

## 2023-09-26 ENCOUNTER — Encounter: Payer: Self-pay | Admitting: Neurology

## 2023-09-29 ENCOUNTER — Encounter: Payer: Self-pay | Admitting: Family Medicine

## 2023-09-29 MED ORDER — METFORMIN HCL 500 MG PO TABS: 500.0000 mg | ORAL_TABLET | Freq: Two times a day (BID) | ORAL | 3 refills | Status: AC

## 2023-10-02 ENCOUNTER — Telehealth: Payer: Self-pay | Admitting: Pulmonary Disease

## 2023-10-02 NOTE — Telephone Encounter (Signed)
 Patient needs to be rescheduled with one of the pulmonary Doctors.

## 2023-10-08 NOTE — Telephone Encounter (Signed)
 Submitted auth renewal via CMM, status is pending. Key: [id number]

## 2023-10-09 ENCOUNTER — Telehealth: Payer: Self-pay | Admitting: Adult Health

## 2023-10-09 NOTE — Telephone Encounter (Signed)
 Received approval, pt will continue to fill through [facility]. Auth#: [id number] (10/08/23-01/08/24)

## 2023-10-09 NOTE — Telephone Encounter (Signed)
 [facility]  called with delivery date of Botox  for 8-12 GNA address and hours verified content of delivery: quantity 2, 100 units, SDV, 90 day supply

## 2023-10-09 NOTE — Telephone Encounter (Signed)
Updated appt note

## 2023-10-17 ENCOUNTER — Other Ambulatory Visit: Payer: Self-pay | Admitting: Medical Genetics

## 2023-10-22 ENCOUNTER — Ambulatory Visit: Admitting: Nurse Practitioner

## 2023-11-06 ENCOUNTER — Telehealth: Payer: Self-pay | Admitting: Adult Health

## 2023-11-06 ENCOUNTER — Telehealth (INDEPENDENT_AMBULATORY_CARE_PROVIDER_SITE_OTHER): Admitting: Neurology

## 2023-11-06 DIAGNOSIS — R519 Headache, unspecified: Secondary | ICD-10-CM

## 2023-11-06 DIAGNOSIS — S134XXA Sprain of ligaments of cervical spine, initial encounter: Secondary | ICD-10-CM

## 2023-11-06 DIAGNOSIS — S0990XA Unspecified injury of head, initial encounter: Secondary | ICD-10-CM | POA: Diagnosis not present

## 2023-11-06 MED ORDER — METHOCARBAMOL 750 MG PO TABS: 750.0000 mg | ORAL_TABLET | Freq: Three times a day (TID) | ORAL | 2 refills | Status: DC | PRN

## 2023-11-06 MED ORDER — PROPRANOLOL HCL 10 MG PO TABS: 10.0000 mg | ORAL_TABLET | Freq: Two times a day (BID) | ORAL | 6 refills | Status: AC

## 2023-11-06 NOTE — Telephone Encounter (Signed)
 Patient's husband called patient unable to come to appointment due to feeling sick on the stomach. Requested to change appointment to a virtual.  .. Pt understands that although there may be some limitations with this type of visit, we will take all precautions to reduce any security or privacy concerns.  Pt understands that this will be treated like an in office visit and we will file with pt's insurance, and there may be a patient responsible charge related to this service.
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
## 2023-11-06 NOTE — Patient Instructions (Addendum)
 CT scan of the head Robaxin  tid prn Propranol low dose

## 2023-11-10 ENCOUNTER — Other Ambulatory Visit: Payer: Self-pay | Admitting: Neurological Surgery

## 2023-11-10 ENCOUNTER — Encounter: Payer: Self-pay | Admitting: Neurological Surgery

## 2023-11-10 DIAGNOSIS — M47812 Spondylosis without myelopathy or radiculopathy, cervical region: Secondary | ICD-10-CM

## 2023-11-11 ENCOUNTER — Ambulatory Visit (INDEPENDENT_AMBULATORY_CARE_PROVIDER_SITE_OTHER): Admitting: Adult Health

## 2023-11-11 ENCOUNTER — Encounter: Payer: Self-pay | Admitting: Adult Health

## 2023-11-11 VITALS — BP 156/105 | HR 79

## 2023-11-11 DIAGNOSIS — G43709 Chronic migraine without aura, not intractable, without status migrainosus: Secondary | ICD-10-CM

## 2023-11-11 MED ORDER — ONABOTULINUMTOXINA 100 UNITS IJ SOLR
155.0000 [IU] | Freq: Once | INTRAMUSCULAR | Status: AC
  Administered 2023-11-11: 155 [IU] via INTRAMUSCULAR

## 2023-11-11 NOTE — Progress Notes (Signed)
 Botox - 100 units x 2 vials Lot: [id number] Expiration: 01/2026 NDC: 9976-8854-98  Bacteriostatic 0.9% Sodium Chloride - 4 mL  Lot: [id number] Expiration: 01/01/2025 NDC: 9590-8033-97  Dx: H56.290 S/P  Witnessed by Rojean DEL CMA
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
## 2023-11-12 ENCOUNTER — Encounter: Payer: Self-pay | Admitting: Neurological Surgery

## 2023-11-12 ENCOUNTER — Encounter: Payer: Self-pay | Admitting: Neurology

## 2023-11-13 ENCOUNTER — Telehealth: Payer: Self-pay | Admitting: Neurology

## 2023-11-13 NOTE — Telephone Encounter (Signed)
 We received a shipment of Methocarbamol  750MG  from [facility], I gave to pod 4.

## 2023-11-18 ENCOUNTER — Encounter: Payer: Self-pay | Admitting: Neurology

## 2023-11-19 ENCOUNTER — Other Ambulatory Visit

## 2023-11-24 MED ORDER — ONDANSETRON HCL 4 MG PO TABS: 4.0000 mg | ORAL_TABLET | Freq: Three times a day (TID) | ORAL | 0 refills | Status: DC | PRN

## 2023-11-24 NOTE — Telephone Encounter (Signed)
 I sent a script for 20 tab of generic Zofran  4 mg for this patient of Dr Ines.  Tid  prn use.

## 2023-11-24 NOTE — Addendum Note (Signed)
 Addended by: CHALICE SAUNAS on: 11/24/2023 05:33 PM   Modules accepted: Orders

## 2023-11-26 ENCOUNTER — Ambulatory Visit

## 2023-12-01 NOTE — Telephone Encounter (Signed)
 Zofran  4 mg PO bottle came to our office from [facility]. Ready for pt pickup.

## 2023-12-03 ENCOUNTER — Ambulatory Visit
Admission: RE | Admit: 2023-12-03 | Discharge: 2023-12-03 | Disposition: A | Discharge status: Home / Self Care | Source: Ambulatory Visit | Attending: Neurological Surgery | Admitting: Neurological Surgery

## 2023-12-03 ENCOUNTER — Ambulatory Visit
Admission: RE | Admit: 2023-12-03 | Discharge: 2023-12-03 | Disposition: A | Discharge status: Home / Self Care | Source: Ambulatory Visit | Attending: Neurology | Admitting: Neurology

## 2023-12-03 DIAGNOSIS — M47812 Spondylosis without myelopathy or radiculopathy, cervical region: Secondary | ICD-10-CM

## 2023-12-03 DIAGNOSIS — R519 Headache, unspecified: Secondary | ICD-10-CM

## 2023-12-03 DIAGNOSIS — S0990XA Unspecified injury of head, initial encounter: Secondary | ICD-10-CM

## 2023-12-07 DIAGNOSIS — R519 Headache, unspecified: Secondary | ICD-10-CM | POA: Diagnosis not present

## 2023-12-07 DIAGNOSIS — S0990XA Unspecified injury of head, initial encounter: Secondary | ICD-10-CM | POA: Diagnosis not present

## 2023-12-24 NOTE — Telephone Encounter (Signed)
 For now, I recommend follow-up with Dr. Colon.  Recent neck MRI showed some degenerative changes in the neck which could contribute to headache in the back of her head.  Also, I recommend follow-up with eye doctor if she has not seen her eye doctor since she has had pain behind the eyes.

## 2023-12-31 ENCOUNTER — Ambulatory Visit

## 2024-01-03 ENCOUNTER — Encounter: Payer: Self-pay | Admitting: Family Medicine

## 2024-01-03 DIAGNOSIS — S2220XS Unspecified fracture of sternum, sequela: Secondary | ICD-10-CM

## 2024-01-04 ENCOUNTER — Other Ambulatory Visit: Payer: Self-pay | Admitting: Medical Genetics

## 2024-01-04 DIAGNOSIS — Z006 Encounter for examination for normal comparison and control in clinical research program: Secondary | ICD-10-CM

## 2024-01-06 ENCOUNTER — Encounter: Payer: Self-pay | Admitting: *Deleted

## 2024-01-06 NOTE — Telephone Encounter (Signed)
 That's fine

## 2024-01-07 NOTE — Telephone Encounter (Signed)
 That's fine

## 2024-01-14 ENCOUNTER — Telehealth: Payer: Self-pay | Admitting: Adult Health

## 2024-01-14 NOTE — Telephone Encounter (Signed)
 Submitted auth renewal request via CMM, status is pending. Key: [id number]

## 2024-01-20 NOTE — Telephone Encounter (Signed)
 Auth#: [id number] (01/14/24-01/13/25)

## 2024-01-28 ENCOUNTER — Ambulatory Visit

## 2024-02-04 ENCOUNTER — Encounter: Payer: Self-pay | Admitting: Podiatry

## 2024-02-04 ENCOUNTER — Ambulatory Visit: Admitting: Podiatry

## 2024-02-04 VITALS — Ht 63.0 in | Wt 247.0 lb

## 2024-02-04 DIAGNOSIS — L600 Ingrowing nail: Secondary | ICD-10-CM | POA: Diagnosis not present

## 2024-02-04 NOTE — Progress Notes (Signed)
 Chief Complaint  Patient presents with   Ingrown Toenail    Pt is here due to possible ingrown to the left great toenail, would like to have it removed.    Subjective: Patient presents today for evaluation of pain to the medial and lateral border left great toe. Patient is concerned for possible ingrown nail.  It is very sensitive to touch.  Patient presents today for further treatment and evaluation.  Past Medical History:  Diagnosis Date   Anxiety    occasional per pt on 12/27/21   COVID-19 04/26/2021   Diabetes mellitus without complication (HCC)    Type 2, 08/19/21 A1c 9.2 in Epic, Follows w/ Powell Lessen, NP, LOV 10/26/21 in Epic.   GERD (gastroesophageal reflux disease)    Gout 12/2020   Pt denies   Migraines    Currently receiving Botox  injections for migraines. Last Botox  on 11/20/21 as of 12/25/21. Follws with Dr. Ines at [facility].   Neck pain    PONV (postoperative nausea and vomiting)    Wears contact lenses    Wears glasses     Past Surgical History:  Procedure Laterality Date   APPENDECTOMY  07/2019   @ [facility]   CESAREAN SECTION  2007   COLONOSCOPY  01/04/2021   COLONOSCOPY WITH ESOPHAGOGASTRODUODENOSCOPY (EGD)  08/12/2007   DILATION AND CURETTAGE OF UTERUS  2006   HYSTERECTOMY ABDOMINAL WITH SALPINGO-OOPHORECTOMY Bilateral 01/01/2022   Procedure: ABDOMINAL HYSTERECTOMY WITH RIGHT SALPINGECTOMY AND LEFT SALPINGO-OOPHORECTOMY;  Surgeon: Mat Browning, MD;  Location: [facility];  Service: Gynecology;  Laterality: Bilateral;   lumbar back surgery     08/2022   LUMBAR LAMINECTOMY  08/02/2020   L4-L5   MELANOMA EXCISION  2008   left breast and left upper third of back   TONSILLECTOMY AND ADENOIDECTOMY  07/13/2017    Allergies  Allergen Reactions   Daptacel [Diphth-Acell Pertussis-Tetanus] Anaphylaxis   Haemophilus Influenzae Vaccines Anaphylaxis, Hives, Nausea And Vomiting, Shortness Of Breath and Swelling    Polyethylene Glycol 2000 Dimyristoyl Glycerol Anaphylaxis, Hives, Hypertension, Nausea And Vomiting, Palpitations and Shortness Of Breath   Soy Allergy (Obsolete) Anaphylaxis   Soybean Oil Anaphylaxis   Soybean-Containing Drug Products Anaphylaxis   Milk-Related Compounds Itching and Nausea Only    Throat itching and lactose intolerant.   Pollen Extract Swelling    Other Reaction(s): Other (See Comments)  Other reaction(s): Eye Swelling   Egg Solids, Whole     Other Reaction(s): Not available, Other (See Comments)   Influenza Vaccines     Other Reaction(s): Other (See Comments)   Other Other (See Comments)    Peg 400-Propylene Glycol   Polyethylene Glycol (Macrogol)     Other Reaction(s): Not available, Other (See Comments)   Oxycodone  Itching    Objective:  General: Well developed, nourished, in no acute distress, alert and oriented x3   Dermatology: Skin is warm, dry and supple bilateral.  Medial and lateral border left great toe is tender with evidence of an ingrowing nail. Pain on palpation noted to the border of the nail fold. The remaining nails appear unremarkable at this time.   Vascular: DP and PT pulses palpable.  No clinical evidence of vascular compromise  Neruologic: Grossly intact via light touch bilateral.  Musculoskeletal: No pedal deformity noted  Assesement: #1 Paronychia with ingrowing nail medial and lateral border LT great toe #2  H/o partial permanent nail matricectomy medial and lateral border RT great toe.  02/10/2023  Plan of Care:  -Patient  evaluated.  -Discussed treatment alternatives and plan of care. Explained nail avulsion procedure and post procedure course to patient. -Patient opted for permanent partial nail avulsion of the ingrown portion of the nail.  -Prior to procedure, local anesthesia infiltration utilized using 3 ml of a 50:50 mixture of 2% plain lidocaine  and 0.5% plain marcaine  in a normal hallux block fashion and a betadine prep  performed.  -Partial permanent nail avulsion with chemical matrixectomy performed using 3x30sec applications of phenol followed by alcohol flush.  -Light dressing applied.  Post care instructions provided -Return to clinic 3 weeks  *NP for [facility] in [place], [place]  Thresa EMERSON Sar, DPM [facility]  Dr. Thresa EMERSON Sar, DPM    [address] [place], [place] [address]                Office [phone]  Fax [phone]

## 2024-02-10 ENCOUNTER — Ambulatory Visit: Admitting: Adult Health

## 2024-02-11 ENCOUNTER — Ambulatory Visit: Admitting: Adult Health

## 2024-02-11 VITALS — BP 136/89 | HR 89

## 2024-02-11 DIAGNOSIS — G43709 Chronic migraine without aura, not intractable, without status migrainosus: Secondary | ICD-10-CM

## 2024-02-11 MED ORDER — ONABOTULINUMTOXINA 200 UNITS IJ SOLR
155.0000 [IU] | Freq: Once | INTRAMUSCULAR | Status: AC
  Administered 2024-02-11: 155 [IU] via INTRAMUSCULAR

## 2024-02-11 MED ORDER — ONABOTULINUMTOXINA 200 UNITS IJ SOLR: 155.0000 [IU] | Freq: Once | INTRAMUSCULAR | Status: DC
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
## 2024-02-11 NOTE — Progress Notes (Signed)
 Botox - 100 units x 2 vials Lot: [id number]/ [id number] Expiration: 01/2026/ 05/2026 NDC: 9976-8854-98 x2   Bacteriostatic 0.9% Sodium Chloride - 4 mL  Lot: [id number] Expiration: 01/01/2025 NDC: 9590-8033-97   Dx: H56.290 SP   Witness: Diandra D
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
## 2024-02-12 ENCOUNTER — Encounter: Payer: Self-pay | Admitting: Podiatry

## 2024-03-03 ENCOUNTER — Ambulatory Visit

## 2024-03-03 DIAGNOSIS — G43709 Chronic migraine without aura, not intractable, without status migrainosus: Secondary | ICD-10-CM

## 2024-03-04 ENCOUNTER — Ambulatory Visit: Payer: Self-pay | Admitting: Adult Health

## 2024-03-10 ENCOUNTER — Other Ambulatory Visit: Payer: Self-pay | Admitting: Neurological Surgery

## 2024-03-10 DIAGNOSIS — M5416 Radiculopathy, lumbar region: Secondary | ICD-10-CM

## 2024-03-11 ENCOUNTER — Encounter: Payer: Self-pay | Admitting: Neurological Surgery

## 2024-03-15 ENCOUNTER — Telehealth: Payer: Self-pay | Admitting: Adult Health

## 2024-03-15 ENCOUNTER — Ambulatory Visit: Admitting: Diagnostic Neuroimaging

## 2024-03-15 ENCOUNTER — Encounter: Payer: Self-pay | Admitting: Diagnostic Neuroimaging

## 2024-03-15 ENCOUNTER — Telehealth: Payer: Self-pay | Admitting: Diagnostic Neuroimaging

## 2024-03-15 NOTE — Telephone Encounter (Signed)
 Spouse reports pt is sick, declined my chart vv

## 2024-03-15 NOTE — Telephone Encounter (Signed)
 Pt Husband called to request to cancel appt due to Pt Is sick  Appt Rescheduled

## 2024-03-16 ENCOUNTER — Ambulatory Visit (INDEPENDENT_AMBULATORY_CARE_PROVIDER_SITE_OTHER)

## 2024-03-16 VITALS — BP 144/102 | HR 86 | Temp 98.2°F | Ht 63.0 in | Wt 249.1 lb

## 2024-03-16 DIAGNOSIS — Z0000 Encounter for general adult medical examination without abnormal findings: Secondary | ICD-10-CM | POA: Diagnosis not present

## 2024-03-16 DIAGNOSIS — E1165 Type 2 diabetes mellitus with hyperglycemia: Secondary | ICD-10-CM

## 2024-03-16 DIAGNOSIS — Z1159 Encounter for screening for other viral diseases: Secondary | ICD-10-CM

## 2024-03-16 DIAGNOSIS — R030 Elevated blood-pressure reading, without diagnosis of hypertension: Secondary | ICD-10-CM

## 2024-03-16 DIAGNOSIS — G43709 Chronic migraine without aura, not intractable, without status migrainosus: Secondary | ICD-10-CM

## 2024-03-16 DIAGNOSIS — Z7984 Long term (current) use of oral hypoglycemic drugs: Secondary | ICD-10-CM | POA: Diagnosis not present

## 2024-03-16 DIAGNOSIS — E118 Type 2 diabetes mellitus with unspecified complications: Secondary | ICD-10-CM | POA: Diagnosis not present

## 2024-03-16 DIAGNOSIS — D509 Iron deficiency anemia, unspecified: Secondary | ICD-10-CM | POA: Diagnosis not present

## 2024-03-16 MED ORDER — TIRZEPATIDE 2.5 MG/0.5ML ~~LOC~~ SOAJ: 2.5000 mg | SUBCUTANEOUS | 3 refills | Status: DC

## 2024-03-16 MED ORDER — ONDANSETRON HCL 4 MG PO TABS: 4.0000 mg | ORAL_TABLET | Freq: Three times a day (TID) | ORAL | 0 refills | Status: AC | PRN

## 2024-03-16 NOTE — Patient Instructions (Signed)
 VISIT SUMMARY: Today we discussed the management of your type 2 diabetes, hypertension, chronic migraines, and other health concerns. We reviewed your medications and made some adjustments to better control your symptoms and improve your overall health.  YOUR PLAN: TYPE 2 DIABETES MELLITUS: Your blood sugar levels were temporarily elevated due to a recent steroid injection but have since stabilized. -We have prescribed Mounjaro  2.5 mg for better weight loss and glycemic control. The prescription has been sent to [facility] in [place]. -You will have fasting labs to recheck your A1c, kidney and liver function, and iron levels. -We provided Zofran  to help with potential nausea from Mounjaro . -Please monitor for any side effects and adjust the injection site if necessary.  CHRONIC MIGRAINE WITH OCCIPITAL NEURALGIA: Your chronic migraines are well-controlled with Botox , but you have had an increase in headache frequency recently. -Continue with Botox  injections for migraine control. -Resume taking propranolol  for migraine prevention. -We provided contact information for a cardiology referral.  ELEVATED BLOOD PRESSURE: Your blood pressure has been elevated, likely due to pain and stress, especially during headaches. -We rechecked your blood pressure before you left the office. -Please keep a blood pressure log at home and monitor your readings. -Contact us  if your home readings are consistently high.  GENERAL HEALTH MAINTENANCE: You are up to date on your Pap smear, mammogram, and eye exam. Tetanus vaccine is not recommended due to your history of anaphylaxis. -We ordered an HIV screening as a preventative measure. -We will obtain records from your gynecologist and eye doctor. -We scheduled a follow-up appointment in three months to review your lab results and weight management progress.  If you have any problems before your next visit feel free to message me via MyChart (minor issues or questions)  or call the office, otherwise you may reach out to schedule an office visit.  Thank you! Saddie Sacks, PA-C

## 2024-03-17 ENCOUNTER — Other Ambulatory Visit: Payer: Self-pay | Admitting: Neurosurgery

## 2024-03-17 ENCOUNTER — Encounter: Payer: Self-pay | Admitting: Neurosurgery

## 2024-03-17 DIAGNOSIS — M47814 Spondylosis without myelopathy or radiculopathy, thoracic region: Secondary | ICD-10-CM

## 2024-03-17 MED ORDER — FREESTYLE LIBRE 3 PLUS SENSOR MISC: 5 refills | Status: DC

## 2024-03-17 MED ORDER — ZEPBOUND 2.5 MG/0.5ML ~~LOC~~ SOAJ: 2.5000 mg | SUBCUTANEOUS | 0 refills | Status: DC

## 2024-03-17 NOTE — Addendum Note (Signed)
 Addended byBETHA GAYLE NUMBERS on: 03/17/2024 03:54 PM   Modules accepted: Orders

## 2024-03-18 ENCOUNTER — Other Ambulatory Visit: Payer: Self-pay

## 2024-03-18 DIAGNOSIS — E1165 Type 2 diabetes mellitus with hyperglycemia: Secondary | ICD-10-CM

## 2024-03-18 MED ORDER — DEXCOM G6 SENSOR MISC: 1.0000 | 12 refills | Status: DC

## 2024-03-21 ENCOUNTER — Other Ambulatory Visit: Payer: Self-pay

## 2024-03-21 DIAGNOSIS — E1165 Type 2 diabetes mellitus with hyperglycemia: Secondary | ICD-10-CM

## 2024-03-21 DIAGNOSIS — R030 Elevated blood-pressure reading, without diagnosis of hypertension: Secondary | ICD-10-CM | POA: Insufficient documentation

## 2024-03-21 DIAGNOSIS — Z0000 Encounter for general adult medical examination without abnormal findings: Secondary | ICD-10-CM | POA: Insufficient documentation

## 2024-03-21 MED ORDER — DEXCOM G6 TRANSMITTER MISC: 1.0000 | Freq: Three times a day (TID) | 3 refills | Status: AC

## 2024-03-21 MED ORDER — DEXCOM G7 SENSOR MISC: 1.0000 | 11 refills | Status: AC

## 2024-03-21 NOTE — Assessment & Plan Note (Signed)
 Last A1c 9.1. Recent steroid injection caused transient hyperglycemia. Discussed Mounjaro  for weight loss and glycemic control. Insurance covers Zepbound  at low cost. - Sent prescription for tirzepatide  [facility] [place]. - Continue Metformin  500 mg BID for now  - Scheduled fasting labs to recheck A1c, kidney and liver function, and iron panel. - Provided Zofran  for potential nausea from Mounjaro . - Advised to monitor for side effects and adjust injection site if necessary.

## 2024-03-21 NOTE — Assessment & Plan Note (Signed)
 Likely secondary to pain and stress. Typically well-controlled but elevated during headaches. - Rechecked blood pressure before leaving the office. - Provided blood pressure log for home monitoring. - Advised to message if home readings are consistently high.

## 2024-03-21 NOTE — Assessment & Plan Note (Signed)
 Elevated in office today and on recheck. Likely secondary to pain and stress. Typically well-controlled but elevated during headaches. - Rechecked blood pressure before leaving the office. - Provided blood pressure log for home monitoring. - Advised to message if home readings are consistently high. - If BP remains above goal, will need to initiate ARB therapy.

## 2024-03-21 NOTE — Progress Notes (Signed)
 "  Complete physical exam  Patient: Candice [name]   DOB: August 05, 1975   49 y.o. Female  MRN: [id number]  Subjective:    Chief Complaint  Patient presents with   Annual Exam    Physical     History of Present Illness   Candice [name] is a 49 year old female with type 2 diabetes and hypertension who presents for CPE.   Hypertension - Recent episodes of elevated blood pressure, attributed to stress and headaches.  Headache and occipital neuralgia - Occipital neuralgia-type headaches, associated with elevated blood pressure. - Chronic migraines well-controlled with Botox  injections. - Recent neck issues have triggered occipital nerve headaches. - Currently taking propranolol  10 mg once daily for migraine prevention, but using it less frequently.  Type 2 diabetes mellitus - Currently taking metformin  500 mg twice daily. - Blood glucose elevated to 380 mg/dL following recent steroid injection; now stabilized around 130 mg/dL. - Previously trialed Ozempic , discontinued due to severe nausea.  Nutritional deficiencies - Vegetarian diet. - History of anemia, attributed to diet. - History of low vitamin D  levels.  Ophthalmologic care - Eye exam within the last year at [facility] in [place].  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 - Up to date on other vaccinations.       Most recent fall risk assessment:    03/16/2024    4:26 PM  Fall Risk   Falls in the past year? 0  Injury with Fall? 0  Risk for fall due to : No Fall Risks  Follow up Falls evaluation completed     Most recent depression screenings:    03/16/2024    4:26 PM 10/26/2021   10:52 AM  PHQ 2/9 Scores  PHQ - 2 Score 0 0  PHQ- 9 Score 0 0      Data saved with a previous flowsheet row definition    Vision:Within last year and Dental: No current dental problems and Receives regular dental care    Patient Care Team: Gayle Saddie JULIANNA DEVONNA as PCP -  General (Physician Assistant)   Show/hide medication list[1]  ROS  Per HPI      Objective:     BP (!) 144/102   Pulse 86   Temp 98.2 F (36.8 C) (Oral)   Ht 5' 3 (1.6 m)   Wt 249 lb 1.9 oz (113 kg)   LMP 10/22/2021   SpO2 98%   BMI 44.13 kg/m    Physical Exam Constitutional:      General: She is not in acute distress.    Appearance: Normal appearance.  Cardiovascular:     Rate and Rhythm: Normal rate and regular rhythm.     Heart sounds: Normal heart sounds. No murmur heard.    No friction rub. No gallop.  Pulmonary:     Effort: Pulmonary effort is normal. No respiratory distress.     Breath sounds: Normal breath sounds.  Abdominal:     General: Bowel sounds are normal.  Musculoskeletal:        General: No swelling.     Cervical back: Neck supple.  Lymphadenopathy:     Cervical: No cervical adenopathy.  Skin:    General: Skin is warm and dry.  Neurological:     General: No focal deficit present.     Mental Status: She is alert.  Psychiatric:        Mood and Affect: Mood normal.        Behavior:  Behavior normal.        Thought Content: Thought content normal.       No results found for any visits on 03/16/24. Last CBC Lab Results  Component Value Date   WBC 14.6 (H) 02/28/2023   HGB 14.9 02/28/2023   HCT 46.2 (H) 02/28/2023   MCV 91.8 02/28/2023   MCH 29.6 02/28/2023   RDW 12.9 02/28/2023   PLT 282 02/28/2023   Last metabolic panel Lab Results  Component Value Date   GLUCOSE 145 (H) 12/28/2021   NA 137 12/28/2021   K 3.8 12/28/2021   CL 103 12/28/2021   CO2 25 12/28/2021   BUN 6 12/28/2021   CREATININE 0.85 02/28/2023   GFRNONAA >60 02/28/2023   CALCIUM 9.4 12/28/2021   PROT 6.9 11/02/2020   ALBUMIN 4.3 11/02/2020   LABGLOB 2.6 11/02/2020   AGRATIO 1.7 11/02/2020   BILITOT 0.2 11/02/2020   ALKPHOS 94 11/02/2020   AST 21 11/02/2020   ALT 40 (H) 11/02/2020   ANIONGAP 9 12/28/2021   Last lipids Lab Results  Component Value Date    CHOL 237 (H) 10/09/2018   HDL 52 10/09/2018   LDLCALC 159 (H) 10/09/2018   TRIG 128 10/09/2018   CHOLHDL 4 09/08/2008   Last hemoglobin A1c Lab Results  Component Value Date   HGBA1C 9.1 (H) 04/26/2021   Last thyroid functions Lab Results  Component Value Date   TSH 2.090 11/02/2020   FREET4 1.07 11/02/2020   Last vitamin D  No results found for: 25OHVITD2, 25OHVITD3, VD25OH      Assessment & Plan:    Routine Health Maintenance and Physical Exam  Health Maintenance  Topic Date Due   Complete foot exam   Never done   Eye exam for diabetics  Never done   HIV Screening  Never done   Kidney health urinalysis for diabetes  Never done   Hepatitis B Vaccine (1 of 3 - 19+ 3-dose series) Never done   Breast Cancer Screening  Never done   Hemoglobin A1C  10/24/2021   Yearly kidney function blood test for diabetes  02/28/2024   COVID-19 Vaccine (1) 04/01/2024*   Flu Shot  06/01/2024*   DTaP/Tdap/Td vaccine (2 - Tdap) 03/16/2025*   Pneumococcal Vaccine (1 of 2 - PCV) 03/16/2025*   Colon Cancer Screening  01/05/2031   HPV Vaccine (No Doses Required) Completed   Hepatitis C Screening  Completed   Meningitis B Vaccine  Aged Out  *Topic was postponed. The date shown is not the original due date.    Discussed health benefits of physical activity, and encouraged her to engage in regular exercise appropriate for her age and condition.  Type 2 diabetes mellitus with hyperglycemia, without long-term current use of insulin  (HCC) Assessment & Plan: Last A1c 9.1. Recent steroid injection caused transient hyperglycemia. Discussed Mounjaro  for weight loss and glycemic control. Insurance covers Zepbound  at low cost. - Sent prescription for tirzepatide  2.5 mg to [facility] in [place]. - Continue Metformin  500 mg BID for now  - Scheduled fasting labs to recheck A1c, kidney and liver function, and iron panel. - Provided Zofran  for potential nausea from Mounjaro . - Advised to monitor for  side effects and adjust injection site if necessary.  Orders: -     Tirzepatide ; Inject 2.5 mg into the skin once a week.  Dispense: 2 mL; Refill: 3 -     Microalbumin / creatinine urine ratio; Future -     Hemoglobin A1c; Future  Controlled diabetes mellitus  type 2 with complications (HCC)  Morbid obesity (HCC) -     Thyroid Panel With TSH; Future -     Lipid panel; Future -     Comprehensive metabolic panel with GFR; Future -     VITAMIN D  25 Hydroxy (Vit-D Deficiency, Fractures); Future  Screening for viral disease -     HIV Antibody (routine testing w rflx); Future  Iron deficiency anemia, unspecified iron deficiency anemia type -     Iron, TIBC and Ferritin Panel; Future -     CBC with Differential/Platelet; Future  Chronic migraine without aura without status migrainosus, not intractable Assessment & Plan: Chronic migraines exacerbated by recent back surgery. Botox  effective for control. Propranolol  used for prevention. Recent increase in headache frequency. - Continue Botox  injections for migraine control. - Resume propranolol  for migraine prevention.   Elevated BP reading w/ no diagnosis of HTN Assessment & Plan: Elevated in office today and on recheck. Likely secondary to pain and stress. Typically well-controlled but elevated during headaches. - Rechecked blood pressure before leaving the office. - Provided blood pressure log for home monitoring. - Advised to message if home readings are consistently high. - If BP remains above goal, will need to initiate ARB therapy.    Healthcare maintenance Assessment & Plan: Likely secondary to pain and stress. Typically well-controlled but elevated during headaches. - Rechecked blood pressure before leaving the office. - Provided blood pressure log for home monitoring. - Advised to message if home readings are consistently high.   Other orders -     Ondansetron  HCl; Take 1 tablet (4 mg total) by mouth every 8 (eight)  hours as needed for nausea or vomiting.  Dispense: 20 tablet; Refill: 0    Return in about 3 months (around 06/14/2024) for HTN, DM.     Saddie JULIANNA Sacks, PA-C     [1]  Outpatient Medications Prior to Visit  Medication Sig   ALPRAZolam  (XANAX ) 0.25 MG tablet Take 0.25 mg by mouth at bedtime as needed for anxiety.   botulinum toxin Type A  (BOTOX ) 100 units SOLR injection INJECT 155 UNITS INTRAMUSCULARLY TO THE HEAD AND NECK EVERY 3  MONTHS   Cholecalciferol  (VITAMIN D -3) 125 MCG (5000 UT) TABS Take 5,000 Units by mouth daily.   metFORMIN  (GLUCOPHAGE ) 500 MG tablet Take 1 tablet (500 mg total) by mouth 2 (two) times daily with a meal.   propranolol  (INDERAL ) 10 MG tablet Take 1 tablet (10 mg total) by mouth 2 (two) times daily.   tretinoin (RETIN-A) 0.05 % cream Apply 1 application  topically every 30 (thirty) days.   [DISCONTINUED] methocarbamol  (ROBAXIN ) 750 MG tablet Take 1 tablet (750 mg total) by mouth 3 (three) times daily as needed for muscle spasms. (Patient not taking: Reported on 03/16/2024)   [DISCONTINUED] ondansetron  (ZOFRAN ) 4 MG tablet Take 1 tablet (4 mg total) by mouth every 8 (eight) hours as needed for nausea or vomiting. (Patient not taking: Reported on 03/16/2024)   No facility-administered medications prior to visit.   "

## 2024-03-21 NOTE — Assessment & Plan Note (Signed)
 Chronic migraines exacerbated by recent back surgery. Botox  effective for control. Propranolol  used for prevention. Recent increase in headache frequency. - Continue Botox  injections for migraine control. - Resume propranolol  for migraine prevention.

## 2024-03-22 ENCOUNTER — Telehealth: Payer: Self-pay

## 2024-03-22 NOTE — Telephone Encounter (Signed)
 Copied from CRM [id number]. Topic: Medical Record Request - Other >> Mar 22, 2024 10:11 AM Vena HERO wrote: Reason for CRM: Tammy from [facility] in [place] called in to let Saddie Sacks know that they received the medical records request however there is no information to be sent. Pt has not been seen there since 2019 and did not have diabetes then.

## 2024-04-01 ENCOUNTER — Encounter: Payer: Self-pay | Admitting: *Deleted

## 2024-04-01 ENCOUNTER — Ambulatory Visit: Payer: Self-pay

## 2024-04-01 DIAGNOSIS — E538 Deficiency of other specified B group vitamins: Secondary | ICD-10-CM | POA: Insufficient documentation

## 2024-04-01 LAB — CBC WITH DIFFERENTIAL/PLATELET
Basophils Absolute: 0 10*3/uL (ref 0.0–0.2)
Basos: 1 %
EOS (ABSOLUTE): 0.2 10*3/uL (ref 0.0–0.4)
Eos: 3 %
Hematocrit: 43.5 % (ref 34.0–46.6)
Hemoglobin: 13.8 g/dL (ref 11.1–15.9)
Immature Grans (Abs): 0.1 10*3/uL (ref 0.0–0.1)
Immature Granulocytes: 1 %
Lymphocytes Absolute: 2.2 10*3/uL (ref 0.7–3.1)
Lymphs: 28 %
MCH: 28 pg (ref 26.6–33.0)
MCHC: 31.7 g/dL (ref 31.5–35.7)
MCV: 88 fL (ref 79–97)
Monocytes Absolute: 0.3 10*3/uL (ref 0.1–0.9)
Monocytes: 4 %
Neutrophils Absolute: 5.2 10*3/uL (ref 1.4–7.0)
Neutrophils: 63 %
Platelets: 320 10*3/uL (ref 150–450)
RBC: 4.92 x10E6/uL (ref 3.77–5.28)
RDW: 14 % (ref 11.7–15.4)
WBC: 8.1 10*3/uL (ref 3.4–10.8)

## 2024-04-01 LAB — LIPID PANEL
Chol/HDL Ratio: 4.6 ratio — ABNORMAL HIGH (ref 0.0–4.4)
Cholesterol, Total: 297 mg/dL — ABNORMAL HIGH (ref 100–199)
HDL: 65 mg/dL
LDL Chol Calc (NIH): 207 mg/dL — ABNORMAL HIGH (ref 0–99)
Triglycerides: 139 mg/dL (ref 0–149)
VLDL Cholesterol Cal: 25 mg/dL (ref 5–40)

## 2024-04-01 LAB — MICROALBUMIN / CREATININE URINE RATIO
Creatinine, Urine: 175.2 mg/dL
Microalb/Creat Ratio: 7 mg/g{creat} (ref 0–29)
Microalbumin, Urine: 13.1 ug/mL

## 2024-04-01 LAB — COMPREHENSIVE METABOLIC PANEL WITH GFR
ALT: 30 [IU]/L (ref 0–32)
AST: 16 [IU]/L (ref 0–40)
Albumin: 4.3 g/dL (ref 3.9–4.9)
Alkaline Phosphatase: 110 [IU]/L (ref 41–116)
BUN/Creatinine Ratio: 16 (ref 9–23)
BUN: 11 mg/dL (ref 6–24)
Bilirubin Total: 0.3 mg/dL (ref 0.0–1.2)
CO2: 22 mmol/L (ref 20–29)
Calcium: 9.6 mg/dL (ref 8.7–10.2)
Chloride: 100 mmol/L (ref 96–106)
Creatinine, Ser: 0.67 mg/dL (ref 0.57–1.00)
Globulin, Total: 2.4 g/dL (ref 1.5–4.5)
Glucose: 146 mg/dL — ABNORMAL HIGH (ref 70–99)
Potassium: 4.3 mmol/L (ref 3.5–5.2)
Sodium: 138 mmol/L (ref 134–144)
Total Protein: 6.7 g/dL (ref 6.0–8.5)
eGFR: 109 mL/min/{1.73_m2}

## 2024-04-01 LAB — VITAMIN D 25 HYDROXY (VIT D DEFICIENCY, FRACTURES): Vit D, 25-Hydroxy: 60.1 ng/mL (ref 30.0–100.0)

## 2024-04-01 LAB — HEMOGLOBIN A1C
Est. average glucose Bld gHb Est-mCnc: 212 mg/dL
Hgb A1c MFr Bld: 9 % — ABNORMAL HIGH (ref 4.8–5.6)

## 2024-04-01 LAB — THYROID PANEL WITH TSH
Free Thyroxine Index: 2.1 (ref 1.2–4.9)
T3 Uptake Ratio: 21 % — ABNORMAL LOW (ref 24–39)
T4, Total: 10.2 ug/dL (ref 4.5–12.0)
TSH: 2.11 u[IU]/mL (ref 0.450–4.500)

## 2024-04-01 LAB — IRON,TIBC AND FERRITIN PANEL
Ferritin: 57 ng/mL (ref 15–150)
Iron Saturation: 17 % (ref 15–55)
Iron: 75 ug/dL (ref 27–159)
Total Iron Binding Capacity: 436 ug/dL (ref 250–450)
UIBC: 361 ug/dL (ref 131–425)

## 2024-04-01 LAB — HIV ANTIBODY (ROUTINE TESTING W REFLEX): HIV Screen 4th Generation wRfx: NONREACTIVE

## 2024-04-01 NOTE — Telephone Encounter (Signed)
 Lab added as requested.  [facility] will run if enough specimen was collected.

## 2024-04-02 ENCOUNTER — Ambulatory Visit
Admission: RE | Admit: 2024-04-02 | Discharge: 2024-04-02 | Disposition: A | Source: Ambulatory Visit | Attending: Neurological Surgery | Admitting: Neurological Surgery

## 2024-04-02 ENCOUNTER — Ambulatory Visit
Admission: RE | Admit: 2024-04-02 | Discharge: 2024-04-02 | Disposition: A | Source: Ambulatory Visit | Attending: Neurosurgery | Admitting: Neurosurgery

## 2024-04-02 DIAGNOSIS — M5416 Radiculopathy, lumbar region: Secondary | ICD-10-CM

## 2024-04-02 DIAGNOSIS — M47814 Spondylosis without myelopathy or radiculopathy, thoracic region: Secondary | ICD-10-CM

## 2024-04-02 LAB — VITAMIN B12: Vitamin B-12: 742 pg/mL (ref 232–1245)

## 2024-04-02 LAB — SPECIMEN STATUS REPORT

## 2024-04-02 MED ORDER — GADOPICLENOL 0.5 MMOL/ML IV SOLN
10.0000 mL | Freq: Once | INTRAVENOUS | Status: AC | PRN
  Administered 2024-04-02: 10 mL via INTRAVENOUS

## 2024-04-03 DIAGNOSIS — E1165 Type 2 diabetes mellitus with hyperglycemia: Secondary | ICD-10-CM

## 2024-04-05 MED ORDER — TIRZEPATIDE 5 MG/0.5ML ~~LOC~~ SOAJ: 5.0000 mg | SUBCUTANEOUS | 2 refills | Status: AC

## 2024-04-28 ENCOUNTER — Ambulatory Visit: Admitting: Diagnostic Neuroimaging

## 2024-05-11 ENCOUNTER — Ambulatory Visit: Admitting: Adult Health

## 2024-06-15 ENCOUNTER — Ambulatory Visit
